# Patient Record
Sex: Female | Born: 1945 | Race: White | Hispanic: No | Marital: Married | Smoking: Current every day smoker
Health system: Southern US, Community
[De-identification: ages and names within clinical notes are randomized; demographics above are authoritative.]

## PROBLEM LIST (undated history)

## (undated) DIAGNOSIS — I700 Atherosclerosis of aorta: Secondary | ICD-10-CM

## (undated) DIAGNOSIS — E079 Disorder of thyroid, unspecified: Secondary | ICD-10-CM

## (undated) DIAGNOSIS — M810 Age-related osteoporosis without current pathological fracture: Secondary | ICD-10-CM

## (undated) DIAGNOSIS — E039 Hypothyroidism, unspecified: Secondary | ICD-10-CM

## (undated) DIAGNOSIS — J439 Emphysema, unspecified: Secondary | ICD-10-CM

## (undated) DIAGNOSIS — M1990 Unspecified osteoarthritis, unspecified site: Secondary | ICD-10-CM

## (undated) HISTORY — PX: TUBAL LIGATION: SHX77

## (undated) HISTORY — PX: THYROIDECTOMY, PARTIAL: SHX18

## (undated) HISTORY — PX: TONSILLECTOMY: SUR1361

## (undated) HISTORY — DX: Atherosclerosis of aorta: I70.0

## (undated) HISTORY — DX: Age-related osteoporosis without current pathological fracture: M81.0

## (undated) HISTORY — DX: Emphysema, unspecified: J43.9

---

## 2008-05-04 ENCOUNTER — Ambulatory Visit: Payer: Self-pay | Admitting: Family Medicine

## 2009-11-26 ENCOUNTER — Ambulatory Visit: Payer: Self-pay | Admitting: Internal Medicine

## 2011-11-07 ENCOUNTER — Ambulatory Visit: Payer: Self-pay | Admitting: Family Medicine

## 2012-10-18 ENCOUNTER — Ambulatory Visit: Payer: Self-pay | Admitting: Internal Medicine

## 2014-06-29 ENCOUNTER — Ambulatory Visit: Payer: Self-pay | Admitting: Family Medicine

## 2014-07-22 ENCOUNTER — Ambulatory Visit: Payer: Self-pay | Admitting: Family Medicine

## 2015-02-17 ENCOUNTER — Emergency Department: Payer: Self-pay | Admitting: Emergency Medicine

## 2015-09-24 ENCOUNTER — Ambulatory Visit
Admission: EM | Admit: 2015-09-24 | Discharge: 2015-09-24 | Disposition: A | Discharge status: Home / Self Care | Payer: Medicare Other | Attending: Family Medicine | Admitting: Family Medicine

## 2015-09-24 ENCOUNTER — Encounter: Payer: Self-pay | Admitting: Gynecology

## 2015-09-24 DIAGNOSIS — L255 Unspecified contact dermatitis due to plants, except food: Secondary | ICD-10-CM | POA: Diagnosis not present

## 2015-09-24 DIAGNOSIS — K1370 Unspecified lesions of oral mucosa: Secondary | ICD-10-CM

## 2015-09-24 HISTORY — DX: Disorder of thyroid, unspecified: E07.9

## 2015-09-24 HISTORY — DX: Unspecified osteoarthritis, unspecified site: M19.90

## 2015-09-24 MED ORDER — PREDNISONE 5 MG PO TABS: 5.0000 mg | ORAL_TABLET | Freq: Every day | ORAL | Status: AC

## 2015-09-24 MED ORDER — DIPHENHYDRAMINE HCL 25 MG PO TABS: 25.0000 mg | ORAL_TABLET | Freq: Four times a day (QID) | ORAL | Status: DC

## 2015-09-24 MED ORDER — PREDNISONE 20 MG PO TABS: 40.0000 mg | ORAL_TABLET | Freq: Every day | ORAL | Status: DC

## 2015-09-24 MED ORDER — RANITIDINE HCL 150 MG PO CAPS: 150.0000 mg | ORAL_CAPSULE | Freq: Two times a day (BID) | ORAL | Status: DC

## 2015-09-24 NOTE — Discharge Instructions (Signed)
Poison [facility] is an inflammation of the skin (contact dermatitis). It is caused by contact with the allergens on the leaves of the oak (toxicodendron) plants. Depending on your sensitivity, the rash may consist simply of redness and itching, or it may also progress to blisters which may break open (rupture). These must be well cared for to prevent secondary germ (bacterial) infection as these infections can lead to scarring. The eyes may also get puffy. The puffiness is worst in the morning and gets better as the day progresses. Healing is best accomplished by keeping any open areas dry, clean, covered with a bandage, and covered with an antibacterial ointment if needed. Without secondary infection, this dermatitis usually heals without scarring within 2 to 3 weeks without treatment. HOME CARE INSTRUCTIONS When you have been exposed to poison oak, it is very important to thoroughly wash with soap and water as soon as the exposure has been discovered. You have about one half hour to remove the plant resin before it will cause the rash. This cleaning will quickly destroy the oil or antigen on the skin (the antigen is what causes the rash). Wash aggressively under the fingernails as any plant resin still there will continue to spread the rash. Do not rub skin vigorously when washing affected area. Poison oak cannot spread if no oil from the plant remains on your body. Rash that has progressed to weeping sores (lesions) will not spread the rash unless you have not washed thoroughly. It is also important to clean any clothes you have been wearing as they may carry active allergens which will spread the rash, even several days later. Avoidance of the plant in the future is the best measure. Poison oak plants can be recognized by the number of leaves. Generally, poison oak has three leaves with flowering branches on a single stem. Diphenhydramine may be purchased over the counter and used as needed for  itching. Do not drive with this medication if it makes you drowsy. Ask your caregiver about medication for children. SEEK IMMEDIATE MEDICAL CARE IF:   Open areas of the rash develop.  You notice redness extending beyond the area of the rash.  There is a pus like discharge.  There is increased pain.  Other signs of infection develop (such as fever).   This information is not intended to replace advice given to you by your health care provider. Make sure you discuss any questions you have with your health care provider.   Document Released: 06/09/2003 Document Revised: 02/25/2012 Document Reviewed: 05/11/2015 Elsevier Interactive Patient Education 2016 [employer]. Poison [employer] ivy is a inflammation of the skin (contact dermatitis) caused by touching the allergens on the leaves of the ivy plant following previous exposure to the plant. The rash usually appears 48 hours after exposure. The rash is usually bumps (papules) or blisters (vesicles) in a linear pattern. Depending on your own sensitivity, the rash may simply cause redness and itching, or it may also progress to blisters which may break open. These must be well cared for to prevent secondary bacterial (germ) infection, followed by scarring. Keep any open areas dry, clean, dressed, and covered with an antibacterial ointment if needed. The eyes may also get puffy. The puffiness is worst in the morning and gets better as the day progresses. This dermatitis usually heals without scarring, within 2 to 3 weeks without treatment. HOME CARE INSTRUCTIONS  Thoroughly wash with soap and water as soon as you have been exposed to  poison ivy. You have about one half hour to remove the plant resin before it will cause the rash. This washing will destroy the oil or antigen on the skin that is causing, or will cause, the rash. Be sure to wash under your fingernails as any plant resin there will continue to spread the rash. Do not rub skin vigorously  when washing affected area. Poison ivy cannot spread if no oil from the plant remains on your body. A rash that has progressed to weeping sores will not spread the rash unless you have not washed thoroughly. It is also important to wash any clothes you have been wearing as these may carry active allergens. The rash will return if you wear the unwashed clothing, even several days later. Avoidance of the plant in the future is the best measure. Poison ivy plant can be recognized by the number of leaves. Generally, poison ivy has three leaves with flowering branches on a single stem. Diphenhydramine may be purchased over the counter and used as needed for itching. Do not drive with this medication if it makes you drowsy.Ask your caregiver about medication for children. SEEK MEDICAL CARE IF:  Open sores develop.  Redness spreads beyond area of rash.  You notice purulent (pus-like) discharge.  You have increased pain.  Other signs of infection develop (such as fever).   This information is not intended to replace advice given to you by your health care provider. Make sure you discuss any questions you have with your health care provider.   Document Released: 11/30/2000 Document Revised: 02/25/2012 Document Reviewed: 05/11/2015 Elsevier Interactive Patient Education 2016 [employer]  Monitor for signs of infection e.g. Fever, chills, purulent discharge, worsening pain/swelling and follow up for re-evaluation if this occurs Do not itch, may rub ice over affected areas discussed benadryl, hydrocortisone, prednisone, zantac uses/possible side effects with patient

## 2015-09-24 NOTE — ED Notes (Signed)
Patient c/o poison oak x last Sunday while working outside her home.

## 2015-09-24 NOTE — ED Provider Notes (Signed)
CSN: [id number]     Arrival date & time 09/24/15  4098 History   First MD Initiated Contact with Patient 09/24/15 1024     Chief Complaint  Patient presents with  . Poison Ivy   (Consider location/radiation/quality/duration/timing/severity/associated sxs/prior Treatment) HPI Comments: Married caucasian female here for spreading rash after accidental exposure to poison oak and ivy last weekend when working in the yard.  It was wrapped up in the ivy and she didn't see until she had already touched, short sleeves/pants was sweating and probably wiped neck/chin.  Has lesions on arms/neck/chin.  Itching like crazy took 2 benadryl last night and put on hydrocortisone cream.  Last episode over a year ago has had poison ivy/oak a couple times since 9th grade.  Denied dyspnea, dysphagia, cough, tongue swelling, vomiting, diarrhea, abdomen pain.    The history is provided by the patient.    Past Medical History  Diagnosis Date  . Thyroid disease   . Osteoarthritis    Past Surgical History  Procedure Laterality Date  . Tonsillectomy    . Tubal ligation     No family history on file. Social History  Substance Use Topics  . Smoking status: Current Every Day Smoker -- 1.00 packs/day    Types: Cigarettes  . Smokeless tobacco: None  . Alcohol Use: Yes   OB History    No data available     Review of Systems  Constitutional: Negative for fever, chills, diaphoresis, activity change, appetite change, fatigue and unexpected weight change.  HENT: Positive for mouth sores. Negative for congestion, dental problem, drooling, ear discharge, ear pain, facial swelling, hearing loss, nosebleeds, postnasal drip, rhinorrhea, sinus pressure, sneezing, sore throat, tinnitus, trouble swallowing and voice change.   Eyes: Negative for photophobia, pain, discharge, redness, itching and visual disturbance.  Respiratory: Negative for cough, choking, chest tightness, shortness of breath, wheezing and stridor.     Cardiovascular: Negative for chest pain, palpitations and leg swelling.  Gastrointestinal: Negative for nausea, vomiting, abdominal pain, diarrhea, constipation, blood in stool, abdominal distention and rectal pain.  Endocrine: Negative for cold intolerance and heat intolerance.  Genitourinary: Negative for dysuria, frequency and hematuria.  Musculoskeletal: Negative for myalgias, back pain, joint swelling, arthralgias, gait problem, neck pain and neck stiffness.  Skin: Positive for color change and rash. Negative for pallor and wound.  Allergic/Immunologic: Positive for environmental allergies. Negative for food allergies.  Neurological: Negative for dizziness, tremors, seizures, syncope, facial asymmetry, speech difficulty, weakness, light-headedness, numbness and headaches.  Hematological: Negative for adenopathy. Does not bruise/bleed easily.  Psychiatric/Behavioral: Negative for behavioral problems, confusion, sleep disturbance and agitation.    Allergies  Sulfa antibiotics  Home Medications   Prior to Admission medications   Medication Sig Start Date End Date Taking? Authorizing Provider  aspirin 81 MG tablet Take 81 mg by mouth daily.   Yes Historical Provider, MD  Calcium Carb-Cholecalciferol (CALCIUM 600-D PO) Take by mouth.   Yes Historical Provider, MD  DiphenhydrAMINE HCl (BENADRYL ALLERGY PO) Take by mouth.   Yes Historical Provider, MD  hydrocortisone cream 0.5 % Apply 1 application topically 2 (two) times daily.   Yes Historical Provider, MD  Multiple Vitamin (MULTIVITAMIN) capsule Take 1 capsule by mouth daily.   Yes Historical Provider, MD  thyroid (ARMOUR) 60 MG tablet Take 60 mg by mouth daily before breakfast.   Yes Historical Provider, MD  diphenhydrAMINE (BENADRYL) 25 MG tablet Take 1 tablet (25 mg total) by mouth every 6 (six) hours. 09/24/15   Jarold Song  Yaniv Lage, NP  predniSONE (DELTASONE) 20 MG tablet Take 2 tablets (40 mg total) by mouth daily with breakfast. x4  days then x4days;  x4d x4d 09/24/15   Barbaraann Barthel, NP  predniSONE (DELTASONE) 5 MG tablet Take 1 tablet (5 mg total) by mouth daily with breakfast. Start after  pills completed 10/10/15 10/13/15  Barbaraann Barthel, NP  ranitidine (ZANTAC) 150 MG capsule Take 1 capsule (150 mg total) by mouth 2 (two) times daily. 09/24/15   Barbaraann Barthel, NP   Meds Ordered and Administered this Visit  Medications - No data to display  BP 114/82 mmHg  Pulse 83  Temp(Src) 98.9 F (37.2 C) (Tympanic)  Resp 16  Ht  (1.651 m)  Wt 132 lb (59.875 kg)  BMI 21.97 kg/m2  SpO2 99% No data found.   Physical Exam  Constitutional: She is oriented to person, place, and time. Vital signs are normal. She appears well-developed and well-nourished. No distress.  HENT:  Head: Normocephalic and atraumatic.  Right Ear: Hearing, external ear and ear canal normal. A middle ear effusion is present.  Left Ear: Hearing, external ear and ear canal normal. A middle ear effusion is present.  Nose: Mucosal edema present. No rhinorrhea, nose lacerations, sinus tenderness, nasal deformity, septal deviation or nasal septal hematoma. No epistaxis.  No foreign bodies. Right sinus exhibits no maxillary sinus tenderness and no frontal sinus tenderness. Left sinus exhibits no maxillary sinus tenderness and no frontal sinus tenderness.  Mouth/Throat: Oropharynx is clear and moist. Mucous membranes are not pale, not dry and not cyanotic. She does not have dentures. Oral lesions present. No trismus in the jaw. Normal dentition. No dental abscesses, uvula swelling, lacerations or dental caries. No oropharyngeal exudate, posterior oropharyngeal edema, posterior oropharyngeal erythema or tonsillar abscesses.    Oral tori x 3 1 hard palate and 2 sublingual 1x2cm each no ulcers oral mucosa noted slight erythema buccal; bilateral TMs with air fluid level clear  Eyes: Conjunctivae, EOM and lids are normal. Pupils are equal,  round, and reactive to light. Right eye exhibits no chemosis, no discharge, no exudate and no hordeolum. No foreign body present in the right eye. Left eye exhibits no chemosis, no discharge, no exudate and no hordeolum. No foreign body present in the left eye. Right conjunctiva is not injected. Right conjunctiva has no hemorrhage. Left conjunctiva is not injected. Left conjunctiva has no hemorrhage. No scleral icterus. Right eye exhibits normal extraocular motion and no nystagmus. Left eye exhibits normal extraocular motion and no nystagmus. Right pupil is round and reactive. Left pupil is round and reactive. Pupils are equal.  Neck: Trachea normal and normal range of motion. Neck supple. No tracheal tenderness, no spinous process tenderness and no muscular tenderness present. No rigidity. Erythema present. No tracheal deviation, no edema and normal range of motion present. No thyroid mass and no thyromegaly present.    Grouped macular papular lesions with slight erythema itchy anterior neck and chin dry  Cardiovascular: Normal rate, regular rhythm, S1 normal, S2 normal, normal heart sounds and intact distal pulses.  Exam reveals no gallop and no friction rub.   No murmur heard. Pulses:      Radial pulses are 2+ on the right side, and 2+ on the left side.  Pulmonary/Chest: Effort normal and breath sounds normal. No accessory muscle usage or stridor. No respiratory distress. She has no decreased breath sounds. She has no wheezes. She has no rhonchi. She has no rales. She exhibits  no tenderness.  Abdominal: Soft. Bowel sounds are normal. She exhibits no shifting dullness, no distension, no pulsatile liver, no fluid wave, no abdominal bruit, no ascites, no pulsatile midline mass and no mass. There is no hepatosplenomegaly. There is no tenderness. There is no rigidity, no rebound, no guarding, no tenderness at McBurney's point and negative Murphy's sign. Hernia confirmed negative in the ventral area.  Dull  to percussion x 4 quads  Musculoskeletal: Normal range of motion. She exhibits no edema or tenderness.  Lymphadenopathy:       Head (right side): No submental, no submandibular, no tonsillar, no preauricular, no posterior auricular and no occipital adenopathy present.       Head (left side): No submental, no submandibular, no tonsillar, no preauricular, no posterior auricular and no occipital adenopathy present.    She has no cervical adenopathy.       Right cervical: No superficial cervical, no deep cervical and no posterior cervical adenopathy present.      Left cervical: No superficial cervical, no deep cervical and no posterior cervical adenopathy present.  Neurological: She is alert and oriented to person, place, and time. She exhibits normal muscle tone. Coordination normal.  Skin: Skin is warm, dry and intact. Rash noted. No abrasion, no bruising, no burn, no ecchymosis, no laceration, no lesion, no petechiae and no purpura noted. Rash is maculopapular and vesicular. Rash is not macular, not papular, not nodular, not pustular and not urticarial. She is not diaphoretic. There is erythema. No cyanosis. No pallor. Nails show no clubbing.     macularpapular erythema mild some vesicules intact small grouped anterior forearms dry patient itching during exam  Psychiatric: She has a normal mood and affect. Her speech is normal and behavior is normal. Judgment and thought content normal. Cognition and memory are normal.  Nursing note and vitals reviewed.   ED Course  Procedures (including critical care time)  Labs Review Labs Reviewed - No data to display  Imaging Review No results found.    MDM   1. Contact dermatitis due to plant   2. Oral lesion    Prefers benadryl for itching.  Has cortisone cream and benadryl at home.  Rx given.  Medication as directed.  Symptomatic therapy suggested e.g. Calamine lotion, benadryl 25mg  po QID prn breakthrough itching. Avoid driving until known if  drowsiness from prednisone and benadryl.  Discussed benadryl can also cause urinary retention and if difficulty urinating to stop benadryl. Start prednisone po taper 40mg  to 5mg  x 20 days.  May use ice if breakthrough itching.  Warm to cool water soaks and/or oatmeal baths.  Wash bedding and towels do not reuse. Call or return to clinic as needed if these symptoms worsen or fail to improve as anticipated especially lesions noted on eye, visual changes or visual loss.  Exitcare handout on poison ivy, poison oak  given to patient.  Discussed avoidance/no contact wear of long sleeves/pants/socks/gloves and handkerchief around neck/mouth/face and use of poison ivy block cream along with tepid shower immediately after completion of yard work.   Do not burn poison oak/ivy.  Avoid scratching lesions to prevent secondary infections.   Patient verbalized agreement and understanding of treatment plan and had no further questions at this time.    Oral Tori patient reported difficulty with brushing teeth due to size.  Discussed biotene mouthwash oral cutettes may also be helpful for oral care.  Keep regular follow up appts with dentist.  Follow up sooner if erythema, purulent discharge,  fever, sudden enlargement/tenderness of tori.  Patient verbalized understanding of information/instructions, agreed with plan of care and had no further questions at this time.  Barbaraann Barthel, NP 09/24/15 1139

## 2015-11-21 ENCOUNTER — Other Ambulatory Visit: Payer: Self-pay | Admitting: Oral & Maxillofacial Surgery

## 2015-11-21 DIAGNOSIS — M278 Other specified diseases of jaws: Secondary | ICD-10-CM

## 2015-11-21 DIAGNOSIS — K1370 Unspecified lesions of oral mucosa: Secondary | ICD-10-CM

## 2015-11-25 ENCOUNTER — Ambulatory Visit
Admission: RE | Admit: 2015-11-25 | Discharge: 2015-11-25 | Disposition: A | Discharge status: Home / Self Care | Payer: Medicare Other | Source: Ambulatory Visit | Attending: Oral & Maxillofacial Surgery | Admitting: Oral & Maxillofacial Surgery

## 2015-11-25 DIAGNOSIS — M278 Other specified diseases of jaws: Secondary | ICD-10-CM | POA: Insufficient documentation

## 2015-11-25 DIAGNOSIS — K1370 Unspecified lesions of oral mucosa: Secondary | ICD-10-CM | POA: Insufficient documentation

## 2016-01-09 DIAGNOSIS — M1990 Unspecified osteoarthritis, unspecified site: Secondary | ICD-10-CM | POA: Insufficient documentation

## 2016-01-09 DIAGNOSIS — E039 Hypothyroidism, unspecified: Secondary | ICD-10-CM | POA: Insufficient documentation

## 2016-01-09 DIAGNOSIS — Z720 Tobacco use: Secondary | ICD-10-CM | POA: Insufficient documentation

## 2016-05-31 ENCOUNTER — Ambulatory Visit (INDEPENDENT_AMBULATORY_CARE_PROVIDER_SITE_OTHER): Payer: Medicare Other | Admitting: Family Medicine

## 2016-05-31 ENCOUNTER — Encounter: Payer: Self-pay | Admitting: Family Medicine

## 2016-05-31 VITALS — BP 110/62 | HR 64 | Ht 65.0 in | Wt 131.0 lb

## 2016-05-31 DIAGNOSIS — T148 Other injury of unspecified body region: Secondary | ICD-10-CM | POA: Diagnosis not present

## 2016-05-31 DIAGNOSIS — W57XXXA Bitten or stung by nonvenomous insect and other nonvenomous arthropods, initial encounter: Secondary | ICD-10-CM

## 2016-05-31 MED ORDER — DOXYCYCLINE MONOHYDRATE 100 MG PO TABS
100.0000 mg | ORAL_TABLET | Freq: Two times a day (BID) | ORAL | Status: DC
Start: 2016-05-31 — End: 2016-11-27

## 2016-05-31 NOTE — Patient Instructions (Signed)

## 2016-05-31 NOTE — Progress Notes (Signed)
Name: [name]   MRN: [id number]    DOB: 04-22-1946   Date:05/31/2016       Progress Note  Subjective  Chief Complaint  Chief Complaint  Patient presents with  . Insect Bite    removed 2 ticks off body- has left behind a spot of redness and irritation on abdomen    Rash This is a chronic problem. The current episode started 1 to 4 weeks ago. The problem is unchanged. The affected locations include the torso and left arm. The rash is characterized by redness. Associated with: tick bite. Pertinent negatives include no anorexia, congestion, cough, diarrhea, eye pain, facial edema, fatigue, fever, joint pain, nail changes, rhinorrhea, shortness of breath, sore throat or vomiting. Past treatments include nothing. The treatment provided mild relief.    No problem-specific assessment & plan notes found for this encounter.   Past Medical History  Diagnosis Date  . Thyroid disease   . Osteoarthritis     Past Surgical History  Procedure Laterality Date  . Tonsillectomy    . Tubal ligation      History reviewed. No pertinent family history.  Social History   Social History  . Marital Status: Married    Spouse Name: N/A  . Number of Children: N/A  . Years of Education: N/A   Occupational History  . Not on file.   Social History Main Topics  . Smoking status: Current Every Day Smoker -- 1.00 packs/day    Types: Cigarettes  . Smokeless tobacco: Not on file  . Alcohol Use: Yes  . Drug Use: No  . Sexual Activity: Not on file   Other Topics Concern  . Not on file   Social History Narrative    Allergies  Allergen Reactions  . Sulfa Antibiotics     GI Bleeding     Review of Systems  Constitutional: Negative for fever, chills, weight loss, malaise/fatigue and fatigue.  HENT: Negative for congestion, ear discharge, ear pain, rhinorrhea and sore throat.   Eyes: Negative for blurred vision and pain.  Respiratory: Negative for cough, sputum production, shortness  of breath and wheezing.   Cardiovascular: Negative for chest pain, palpitations and leg swelling.  Gastrointestinal: Negative for heartburn, nausea, vomiting, abdominal pain, diarrhea, constipation, blood in stool, melena and anorexia.  Genitourinary: Negative for dysuria, urgency, frequency and hematuria.  Musculoskeletal: Negative for myalgias, back pain, joint pain and neck pain.  Skin: Positive for rash. Negative for nail changes.  Neurological: Negative for dizziness, tingling, sensory change, focal weakness and headaches.  Endo/Heme/Allergies: Negative for environmental allergies and polydipsia. Does not bruise/bleed easily.  Psychiatric/Behavioral: Negative for depression and suicidal ideas. The patient is not nervous/anxious and does not have insomnia.      Objective  Filed Vitals:   05/31/16 1614  BP: 110/62  Pulse: 64  Height:  (1.651 m)  Weight: 131 lb (59.421 kg)    Physical Exam  Constitutional: She is well-developed, well-nourished, and in no distress. No distress.  HENT:  Head: Normocephalic and atraumatic.  Right Ear: External ear normal.  Left Ear: External ear normal.  Nose: Nose normal.  Mouth/Throat: Oropharynx is clear and moist.  Eyes: Conjunctivae and EOM are normal. Pupils are equal, round, and reactive to light. Right eye exhibits no discharge. Left eye exhibits no discharge.  Neck: Normal range of motion. Neck supple. No JVD present. No thyromegaly present.  Cardiovascular: Normal rate, regular rhythm, normal heart sounds and intact distal pulses.  Exam reveals no gallop  and no friction rub.   No murmur heard. Pulmonary/Chest: Effort normal and breath sounds normal.  Abdominal: Soft. Bowel sounds are normal. She exhibits no mass. There is no tenderness. There is no guarding.  Musculoskeletal: Normal range of motion. She exhibits no edema.  Lymphadenopathy:    She has no cervical adenopathy.  Neurological: She is alert. She has normal reflexes.   Skin: Skin is warm and dry. She is not diaphoretic. There is erythema.  deertick  Psychiatric: Mood and affect normal.  Nursing note and vitals reviewed.     Assessment & Plan  Problem List Items Addressed This Visit    None    Visit Diagnoses    Tick bite    -  Primary     Doxycycline was sent into [facility]    Dr. Hayden Rasmusseneanna Gared Gillie [facility]  05/31/2016

## 2016-11-27 ENCOUNTER — Ambulatory Visit
Admission: EM | Admit: 2016-11-27 | Discharge: 2016-11-27 | Discharge status: Absent without leave | Payer: Medicare Other

## 2016-11-27 ENCOUNTER — Ambulatory Visit (INDEPENDENT_AMBULATORY_CARE_PROVIDER_SITE_OTHER): Payer: Medicare Other | Admitting: Family Medicine

## 2016-11-27 ENCOUNTER — Encounter: Payer: Self-pay | Admitting: Family Medicine

## 2016-11-27 VITALS — BP 120/62 | HR 80 | Ht 65.0 in | Wt 133.0 lb

## 2016-11-27 DIAGNOSIS — F17210 Nicotine dependence, cigarettes, uncomplicated: Secondary | ICD-10-CM

## 2016-11-27 DIAGNOSIS — H1032 Unspecified acute conjunctivitis, left eye: Secondary | ICD-10-CM

## 2016-11-27 DIAGNOSIS — J0100 Acute maxillary sinusitis, unspecified: Secondary | ICD-10-CM | POA: Diagnosis not present

## 2016-11-27 MED ORDER — TOBRAMYCIN 0.3 % OP SOLN: 1.0000 [drp] | OPHTHALMIC | 0 refills | Status: DC

## 2016-11-27 MED ORDER — AMOXICILLIN-POT CLAVULANATE 875-125 MG PO TABS
1.0000 | ORAL_TABLET | Freq: Two times a day (BID) | ORAL | 0 refills | Status: DC
Start: 2016-11-27 — End: 2017-04-09

## 2016-11-27 MED ORDER — VARENICLINE TARTRATE 0.5 MG X 11 & 1 MG X 42 PO MISC: ORAL | 0 refills | Status: DC

## 2016-11-27 NOTE — Progress Notes (Signed)
Name: Kaitlyn NettersLinda Parker Winders   MRN: [id number]    DOB: 26-Apr-1946   Date:11/27/2016       Progress Note  Subjective  Chief Complaint  Chief Complaint  Patient presents with  . Sinusitis    cough and cong- R) side facial pain  . eye redness    L) eye    Sinusitis  This is a new problem. The current episode started in the past 7 days. The problem has been gradually worsening since onset. There has been no fever. The pain is moderate. Associated symptoms include congestion, coughing, headaches and sinus pressure. Pertinent negatives include no chills, diaphoresis, ear pain, hoarse voice, neck pain, shortness of breath, sneezing, sore throat or swollen glands. Past treatments include oral decongestants. The treatment provided mild relief.    No problem-specific Assessment & Plan notes found for this encounter.   Past Medical History:  Diagnosis Date  . Osteoarthritis   . Osteoporosis   . Thyroid disease     Past Surgical History:  Procedure Laterality Date  . TONSILLECTOMY    . TUBAL LIGATION      History reviewed. No pertinent family history.  Social History   Social History  . Marital status: Married    Spouse name: N/A  . Number of children: N/A  . Years of education: N/A   Occupational History  . Not on file.   Social History Main Topics  . Smoking status: Current Every Day Smoker    Packs/day: 1.00    Types: Cigarettes  . Smokeless tobacco: Not on file  . Alcohol use Yes  . Drug use: No  . Sexual activity: Not Currently   Other Topics Concern  . Not on file   Social History Narrative  . No narrative on file    Allergies  Allergen Reactions  . Sulfa Antibiotics     GI Bleeding     Review of Systems  Constitutional: Negative for chills, diaphoresis, fever, malaise/fatigue and weight loss.  HENT: Positive for congestion and sinus pressure. Negative for ear discharge, ear pain, hoarse voice, nosebleeds, sinus pain, sneezing and sore throat.   Eyes:  Positive for redness. Negative for blurred vision, double vision, photophobia, pain and discharge.  Respiratory: Positive for cough. Negative for sputum production, shortness of breath, wheezing and stridor.   Cardiovascular: Negative for chest pain, palpitations and leg swelling.  Gastrointestinal: Negative for abdominal pain, blood in stool, constipation, diarrhea, heartburn, melena and nausea.  Genitourinary: Negative for dysuria, frequency, hematuria and urgency.  Musculoskeletal: Negative for back pain, joint pain, myalgias and neck pain.  Skin: Negative for rash.  Neurological: Positive for headaches. Negative for dizziness, tingling, sensory change and focal weakness.  Endo/Heme/Allergies: Negative for environmental allergies and polydipsia. Does not bruise/bleed easily.  Psychiatric/Behavioral: Negative for depression and suicidal ideas. The patient is not nervous/anxious and does not have insomnia.      Objective  Vitals:   11/27/16 1349  BP: 120/62  Pulse: 80  Weight: 133 lb (60.3 kg)  Height: 5\' 5"  (1.651 m)    Physical Exam  Constitutional: She is well-developed, well-nourished, and in no distress. No distress.  HENT:  Head: Normocephalic and atraumatic.  Right Ear: External ear normal.  Left Ear: External ear normal.  Nose: Nose normal.  Mouth/Throat: Oropharynx is clear and moist.  Eyes: EOM are normal. Pupils are equal, round, and reactive to light. Right eye exhibits no discharge. Left eye exhibits no discharge. Right conjunctiva is not injected. Left conjunctiva is injected.  Neck: Normal range of motion. Neck supple. No JVD present. No thyromegaly present.  Cardiovascular: Normal rate, regular rhythm, normal heart sounds and intact distal pulses.  Exam reveals no gallop and no friction rub.   No murmur heard. Pulmonary/Chest: Effort normal and breath sounds normal. She has no wheezes. She has no rales. She exhibits no tenderness.  Abdominal: Soft. Bowel sounds  are normal. She exhibits no distension and no mass. There is no tenderness. There is no rebound and no guarding.  Musculoskeletal: Normal range of motion. She exhibits no edema.  Lymphadenopathy:    She has no cervical adenopathy.  Neurological: She is alert. She has normal reflexes.  Skin: Skin is warm and dry. She is not diaphoretic.  Psychiatric: Mood and affect normal.      Assessment & Plan  Problem List Items Addressed This Visit    None    Visit Diagnoses    Acute maxillary sinusitis, recurrence not specified    -  Primary   Relevant Medications   amoxicillin-clavulanate (AUGMENTIN) 875-125 MG tablet   Acute conjunctivitis of left eye, unspecified acute conjunctivitis type       Relevant Medications   tobramycin (TOBREX) 0.3 % ophthalmic solution   Cigarette nicotine dependence without complication       Relevant Medications   varenicline (CHANTIX STARTING MONTH PAK) 0.5 MG X 11 & 1 MG X 42 tablet        Dr. Hayden Rasmusseneanna Jones [facility]  11/27/16

## 2017-04-09 ENCOUNTER — Encounter: Payer: Self-pay | Admitting: Family Medicine

## 2017-04-09 ENCOUNTER — Ambulatory Visit (INDEPENDENT_AMBULATORY_CARE_PROVIDER_SITE_OTHER): Payer: Medicare Other | Admitting: Family Medicine

## 2017-04-09 ENCOUNTER — Ambulatory Visit
Admission: RE | Admit: 2017-04-09 | Discharge: 2017-04-09 | Disposition: A | Discharge status: Home / Self Care | Payer: Medicare Other | Source: Ambulatory Visit | Attending: Family Medicine | Admitting: Family Medicine

## 2017-04-09 VITALS — BP 124/78 | HR 76 | Temp 98.7°F | Ht 65.0 in | Wt 131.0 lb

## 2017-04-09 DIAGNOSIS — J0101 Acute recurrent maxillary sinusitis: Secondary | ICD-10-CM

## 2017-04-09 DIAGNOSIS — J432 Centrilobular emphysema: Secondary | ICD-10-CM

## 2017-04-09 DIAGNOSIS — J449 Chronic obstructive pulmonary disease, unspecified: Secondary | ICD-10-CM | POA: Insufficient documentation

## 2017-04-09 DIAGNOSIS — J40 Bronchitis, not specified as acute or chronic: Secondary | ICD-10-CM | POA: Diagnosis not present

## 2017-04-09 MED ORDER — ALBUTEROL SULFATE HFA 108 (90 BASE) MCG/ACT IN AERS
2.0000 | INHALATION_SPRAY | Freq: Four times a day (QID) | RESPIRATORY_TRACT | 0 refills | Status: DC | PRN
Start: 2017-04-09 — End: 2018-05-21

## 2017-04-09 MED ORDER — GUAIFENESIN-CODEINE 100-10 MG/5ML PO SYRP: 5.0000 mL | ORAL_SOLUTION | Freq: Three times a day (TID) | ORAL | 0 refills | Status: DC | PRN

## 2017-04-09 MED ORDER — AMOXICILLIN-POT CLAVULANATE 875-125 MG PO TABS: 1.0000 | ORAL_TABLET | Freq: Two times a day (BID) | ORAL | 0 refills | Status: DC

## 2017-04-09 NOTE — Progress Notes (Signed)
Name: [name]   MRN: [id number]    DOB: 1946/07/02   Date:04/09/2017       Progress Note  Subjective  Chief Complaint  Chief Complaint  Patient presents with  . Sinusitis    has been having coughing and cong and sore throat x 4 days- sore throat is better but still has cough and cong- small amount of production from nose    Sinusitis  This is a new problem. The current episode started 1 to 4 weeks ago (10 days). The problem has been waxing and waning since onset. Maximum temperature: low grade. The fever has been present for 3 to 4 days. Associated symptoms include chills, congestion, coughing, diaphoresis, headaches, a hoarse voice, shortness of breath, sinus pressure, sneezing and a sore throat. Pertinent negatives include no ear pain, neck pain or swollen glands. The treatment provided mild relief.  Cough  This is a new problem. The current episode started in the past 7 days. The problem has been waxing and waning. The cough is productive of sputum. Associated symptoms include chills, a fever, headaches, nasal congestion, postnasal drip, a sore throat, shortness of breath and wheezing. Pertinent negatives include no chest pain, ear pain, heartburn, hemoptysis, myalgias, rash or weight loss. The symptoms are aggravated by pollens. She has tried nothing for the symptoms. There is no history of environmental allergies.    No problem-specific Assessment & Plan notes found for this encounter.   Past Medical History:  Diagnosis Date  . Osteoarthritis   . Osteoporosis   . Thyroid disease     Past Surgical History:  Procedure Laterality Date  . TONSILLECTOMY    . TUBAL LIGATION      No family history on file.  Social History   Social History  . Marital status: Married    Spouse name: N/A  . Number of children: N/A  . Years of education: N/A   Occupational History  . Not on file.   Social History Main Topics  . Smoking status: Current Every Day Smoker   Packs/day: 1.00    Types: Cigarettes  . Smokeless tobacco: Never Used     Comment: discussed quiting with pt  . Alcohol use Yes  . Drug use: No  . Sexual activity: Not Currently   Other Topics Concern  . Not on file   Social History Narrative  . No narrative on file    Allergies  Allergen Reactions  . Sulfa Antibiotics     GI Bleeding    Outpatient Medications Prior to Visit  Medication Sig Dispense Refill  . alendronate (FOSAMAX) 70 MG tablet Take 1 tablet by mouth once a week. Dr Nelia Shi    . Calcium Carb-Cholecalciferol (CALCIUM 600-D PO) Take by mouth.    . Multiple Vitamin (MULTIVITAMIN) capsule Take 1 capsule by mouth daily.    Marland Kitchen thyroid (ARMOUR) 60 MG tablet Take 60 mg by mouth daily before breakfast.    . amoxicillin-clavulanate (AUGMENTIN) 875-125 MG tablet Take 1 tablet by mouth 2 (two) times daily. 20 tablet 0  . aspirin 81 MG tablet Take 81 mg by mouth daily.    Marland Kitchen tobramycin (TOBREX) 0.3 % ophthalmic solution Place 1 drop into both eyes every 4 (four) hours. 5 mL 0  . varenicline (CHANTIX STARTING MONTH PAK) 0.5 MG X 11 & 1 MG X 42 tablet Take one 0.5 mg tablet by mouth once daily for 3 days, then increase to one 0.5 mg tablet twice daily for 4 days, then  increase to one 1 mg tablet twice daily. 53 tablet 0   No facility-administered medications prior to visit.     Review of Systems  Constitutional: Positive for chills, diaphoresis and fever. Negative for malaise/fatigue and weight loss.  HENT: Positive for congestion, hoarse voice, postnasal drip, sinus pressure, sneezing and sore throat. Negative for ear discharge and ear pain.   Eyes: Negative for blurred vision.  Respiratory: Positive for cough, shortness of breath and wheezing. Negative for hemoptysis and sputum production.   Cardiovascular: Negative for chest pain, palpitations and leg swelling.  Gastrointestinal: Negative for abdominal pain, blood in stool, constipation, diarrhea, heartburn, melena and  nausea.  Genitourinary: Negative for dysuria, frequency, hematuria and urgency.  Musculoskeletal: Negative for back pain, joint pain, myalgias and neck pain.  Skin: Negative for rash.  Neurological: Positive for headaches. Negative for dizziness, tingling, sensory change and focal weakness.  Endo/Heme/Allergies: Negative for environmental allergies and polydipsia. Does not bruise/bleed easily.  Psychiatric/Behavioral: Negative for depression and suicidal ideas. The patient is not nervous/anxious and does not have insomnia.      Objective  Vitals:   04/09/17 0836  BP: 124/78  Pulse: 76  Temp: 98.7 F (37.1 C)  TempSrc: Oral  Weight: 131 lb (59.4 kg)  Height:  (1.651 m)    Physical Exam  Constitutional: She is well-developed, well-nourished, and in no distress. No distress.  HENT:  Head: Normocephalic and atraumatic.  Right Ear: External ear normal.  Left Ear: External ear normal.  Nose: Nose normal.  Mouth/Throat: Oropharynx is clear and moist.  Eyes: Conjunctivae and EOM are normal. Pupils are equal, round, and reactive to light. Right eye exhibits no discharge. Left eye exhibits no discharge.  Neck: Normal range of motion. Neck supple. No JVD present. No thyromegaly present.  Cardiovascular: Normal rate, regular rhythm, normal heart sounds and intact distal pulses.  Exam reveals no gallop and no friction rub.   No murmur heard. Pulmonary/Chest: Effort normal. No respiratory distress. She has wheezes. She has no rales. She exhibits no tenderness.  Abdominal: Soft. Bowel sounds are normal. She exhibits no mass. There is no tenderness. There is no guarding.  Musculoskeletal: Normal range of motion. She exhibits no edema.  Lymphadenopathy:    She has no cervical adenopathy.  Neurological: She is alert.  Skin: Skin is warm and dry. She is not diaphoretic.  Psychiatric: Mood and affect normal.  Nursing note and vitals reviewed.     Assessment & Plan  Problem List  Items Addressed This Visit    None    Visit Diagnoses    Centrilobular emphysema (HCC)    -  Primary   Relevant Medications   albuterol (PROVENTIL HFA;VENTOLIN HFA) 108 (90 Base) MCG/ACT inhaler   guaiFENesin-codeine (ROBITUSSIN AC) 100-10 MG/5ML syrup   Other Relevant Orders   DG Chest 2 View (Completed)   Bronchitis       Relevant Medications   amoxicillin-clavulanate (AUGMENTIN) 875-125 MG tablet   guaiFENesin-codeine (ROBITUSSIN AC) 100-10 MG/5ML syrup   Other Relevant Orders   DG Chest 2 View (Completed)   Acute recurrent maxillary sinusitis       Relevant Medications   amoxicillin-clavulanate (AUGMENTIN) 875-125 MG tablet   guaiFENesin-codeine (ROBITUSSIN AC) 100-10 MG/5ML syrup   Other Relevant Orders   DG Chest 2 View (Completed)      Meds ordered this encounter  Medications  . amoxicillin-clavulanate (AUGMENTIN) 875-125 MG tablet    Sig: Take 1 tablet by mouth 2 (two) times daily.  Dispense:  20 tablet    Refill:  0  . albuterol (PROVENTIL HFA;VENTOLIN HFA) 108 (90 Base) MCG/ACT inhaler    Sig: Inhale 2 puffs into the lungs every 6 (six) hours as needed for wheezing or shortness of breath.    Dispense:  1 Inhaler    Refill:  0  . guaiFENesin-codeine (ROBITUSSIN AC) 100-10 MG/5ML syrup    Sig: Take 5 mLs by mouth 3 (three) times daily as needed for cough.    Dispense:  150 mL    Refill:  0      Dr. Elizabeth Sauer [facility]  04/09/17

## 2017-04-09 NOTE — Patient Instructions (Signed)
How to Use a Metered Dose Inhaler A metered dose inhaler is a handheld device for taking medicine that must be breathed into the lungs (inhaled). The device can be used to deliver a variety of inhaled medicines, including:  Quick relief or rescue medicines, such as bronchodilators.  Controller medicines, such as corticosteroids.  The medicine is delivered by pushing down on a metal canister to release a preset amount of spray and medicine. Each device contains the amount of medicine that is needed for a preset number of uses (inhalations). Your health care provider may recommend that you use a spacer with your inhaler to help you take the medicine more effectively. A spacer is a plastic tube with a mouthpiece on one end and an opening that connects to the inhaler on the other end. A spacer holds the medicine in a tube for a short time, which allows you to inhale more medicine. What are the risks? If you do not use your inhaler correctly, medicine might not reach your lungs to help you breathe. Inhaler medicine can cause side effects, such as:  Mouth or throat infection.  Cough.  Hoarseness.  Headache.  Nausea and vomiting.  Lung infection (pneumonia) in people who have a lung condition called COPD.  How to use a metered dose inhaler without a spacer 1. Remove the cap from the inhaler. 2. If you are using the inhaler for the first time, shake it for 5 seconds, turn it away from your face, then release 4 puffs into the air. This is called priming. 3. Shake the inhaler for 5 seconds. 4. Position the inhaler so the top of the canister faces up. 5. Put your index finger on the top of the medicine canister. Support the bottom of the inhaler with your thumb. 6. Breathe out normally and as completely as possible, away from the inhaler. 7. Either place the inhaler between your teeth and close your lips tightly around the mouthpiece, or hold the inhaler 1-2 inches (2.5-5 cm) away from your open  mouth. Keep your tongue down out of the way. If you are unsure which technique to use, ask your health care provider. 8. Press the canister down with your index finger to release the medicine, then inhale deeply and slowly through your mouth (not your nose) until your lungs are completely filled. Inhaling should take 4-6 seconds. 9. Hold the medicine in your lungs for 5-10 seconds (10 seconds is best). This helps the medicine get into the small airways of your lungs. 10. With your lips in a tight circle (pursed), breathe out slowly. 11. Repeat steps 3-10 until you have taken the number of puffs that your health care provider directed. Wait about 1 minute between puffs or as directed. 12. Put the cap on the inhaler. 13. If you are using a steroid inhaler, rinse your mouth with water, gargle, and spit out the water. Do not swallow the water. How to use a metered dose inhaler with a spacer 1. Remove the cap from the inhaler. 2. If you are using the inhaler for the first time, shake it for 5 seconds, turn it away from your face, then release 4 puffs into the air. This is called priming. 3. Shake the inhaler for 5 seconds. 4. Place the open end of the spacer onto the inhaler mouthpiece. 5. Position the inhaler so the top of the canister faces up and the spacer mouthpiece faces you. 6. Put your index finger on the top of the medicine canister.   Support the bottom of the inhaler and the spacer with your thumb. 7. Breathe out normally and as completely as possible, away from the spacer. 8. Place the spacer between your teeth and close your lips tightly around it. Keep your tongue down out of the way. 9. Press the canister down with your index finger to release the medicine, then inhale deeply and slowly through your mouth (not your nose) until your lungs are completely filled. Inhaling should take 4-6 seconds. 10. Hold the medicine in your lungs for 5-10 seconds (10 seconds is best). This helps the medicine  get into the small airways of your lungs. 11. With your lips in a tight circle (pursed), breathe out slowly. 12. Repeat steps 3-11 until you have taken the number of puffs that your health care provider directed. Wait about 1 minute between puffs or as directed. 13. Remove the spacer from the inhaler and put the cap on the inhaler. 14. If you are using a steroid inhaler, rinse your mouth with water, gargle, and spit out the water. Do not swallow the water. Follow these instructions at home:  Take your inhaled medicine only as told by your health care provider. Do not use the inhaler more than directed by your health care provider.  Keep all follow-up visits as told by your health care provider. This is important.  If your inhaler has a counter, you can check it to determine how full your inhaler is. If your inhaler does not have a counter, ask your health care provider when you will need to refill your inhaler and write the refill date on a calendar or on your inhaler canister. Note that you cannot know when an inhaler is empty by shaking it.  Follow directions on the package insert for care and cleaning of your inhaler and spacer. Contact a health care provider if:  Symptoms are only partially relieved with your inhaler.  You are having trouble using your inhaler.  You have an increase in phlegm.  You have headaches. Get help right away if:  You feel little or no relief after using your inhaler.  You have dizziness.  You have a fast heart rate.  You have chills or a fever.  You have night sweats.  There is blood in your phlegm. Summary  A metered dose inhaler is a handheld device for taking medicine that must be breathed into the lungs (inhaled).  The medicine is delivered by pushing down on a metal canister to release a preset amount of spray and medicine.  Each device contains the amount of medicine that is needed for a preset number of uses (inhalations). This  information is not intended to replace advice given to you by your health care provider. Make sure you discuss any questions you have with your health care provider. Document Released: 12/03/2005 Document Revised: 10/23/2016 Document Reviewed: 10/23/2016 [employer] Interactive Patient Education  2017 [employer]  

## 2017-05-18 ENCOUNTER — Other Ambulatory Visit: Payer: Self-pay | Admitting: Family Medicine

## 2017-06-11 ENCOUNTER — Encounter: Payer: Self-pay | Admitting: Family Medicine

## 2017-06-11 ENCOUNTER — Ambulatory Visit (INDEPENDENT_AMBULATORY_CARE_PROVIDER_SITE_OTHER): Payer: Medicare Other | Admitting: Family Medicine

## 2017-06-11 VITALS — BP 110/64 | HR 68 | Temp 98.8°F | Ht 64.0 in | Wt 134.0 lb

## 2017-06-11 DIAGNOSIS — J0100 Acute maxillary sinusitis, unspecified: Secondary | ICD-10-CM | POA: Diagnosis not present

## 2017-06-11 DIAGNOSIS — J40 Bronchitis, not specified as acute or chronic: Secondary | ICD-10-CM

## 2017-06-11 MED ORDER — AMOXICILLIN-POT CLAVULANATE 875-125 MG PO TABS: 1.0000 | ORAL_TABLET | Freq: Two times a day (BID) | ORAL | 1 refills | Status: DC

## 2017-06-11 NOTE — Progress Notes (Signed)
Name: Kaitlyn NettersLinda Parker [name]   MRN: [id number]    DOB: 10/04/1946   Date:06/11/2017       Progress Note  Subjective  Chief Complaint  Chief Complaint  Patient presents with  . Sinus Problem    Pt stated coughing green mucus and ear problem 3 weeks    Sinus Problem  This is a new problem. The current episode started in the past 7 days. The problem has been gradually worsening since onset. There has been no fever. The pain is mild. Associated symptoms include chills, congestion, coughing, diaphoresis, sinus pressure and a sore throat. Pertinent negatives include no ear pain, headaches, hoarse voice, neck pain, shortness of breath, sneezing or swollen glands. Past treatments include nothing. The treatment provided mild relief.    No problem-specific Assessment & Plan notes found for this encounter.   Past Medical History:  Diagnosis Date  . Osteoarthritis   . Osteoporosis   . Thyroid disease     Past Surgical History:  Procedure Laterality Date  . TONSILLECTOMY    . TUBAL LIGATION      No family history on file.  Social History   Social History  . Marital status: Married    Spouse name: N/A  . Number of children: N/A  . Years of education: N/A   Occupational History  . Not on file.   Social History Main Topics  . Smoking status: Current Every Day Smoker    Packs/day: 1.00    Types: Cigarettes  . Smokeless tobacco: Never Used     Comment: discussed quiting with pt  . Alcohol use Yes  . Drug use: No  . Sexual activity: Not Currently   Other Topics Concern  . Not on file   Social History Narrative  . No narrative on file    Allergies  Allergen Reactions  . Sulfa Antibiotics     GI Bleeding    Outpatient Medications Prior to Visit  Medication Sig Dispense Refill  . albuterol (PROVENTIL HFA;VENTOLIN HFA) 108 (90 Base) MCG/ACT inhaler Inhale 2 puffs into the lungs every 6 (six) hours as needed for wheezing or shortness of breath. 1 Inhaler 0  . alendronate  (FOSAMAX) 70 MG tablet Take 1 tablet by mouth once a week. Dr Nelia ShiMorayarti    . Calcium Carb-Cholecalciferol (CALCIUM 600-D PO) Take by mouth.    Marland Kitchen. guaiFENesin-codeine (ROBITUSSIN AC) 100-10 MG/5ML syrup Take 5 mLs by mouth 3 (three) times daily as needed for cough. 150 mL 0  . Multiple Vitamin (MULTIVITAMIN) capsule Take 1 capsule by mouth daily.    Marland Kitchen. thyroid (ARMOUR) 60 MG tablet Take 60 mg by mouth daily before breakfast.    . Vitamin D, Ergocalciferol, (DRISDOL) 50000 units CAPS capsule Take 50,000 Units by mouth every 7 (seven) days. Dr Nelia ShiMorayarti    . amoxicillin-clavulanate (AUGMENTIN) 875-125 MG tablet Take 1 tablet by mouth 2 (two) times daily. 20 tablet 0  . doxycycline (VIBRA-TABS) 100 MG tablet TAKE ONE TABLET BY MOUTH TWICE DAILY. 14 tablet 0   No facility-administered medications prior to visit.     Review of Systems  Constitutional: Positive for chills and diaphoresis. Negative for fever, malaise/fatigue and weight loss.  HENT: Positive for congestion, sinus pressure and sore throat. Negative for ear discharge, ear pain, hoarse voice and sneezing.   Eyes: Negative for blurred vision.  Respiratory: Positive for cough. Negative for sputum production, shortness of breath and wheezing.   Cardiovascular: Negative for chest pain, palpitations and leg swelling.  Gastrointestinal: Negative  for abdominal pain, blood in stool, constipation, diarrhea, heartburn, melena and nausea.  Genitourinary: Negative for dysuria, frequency, hematuria and urgency.  Musculoskeletal: Negative for back pain, joint pain, myalgias and neck pain.  Skin: Negative for rash.  Neurological: Negative for dizziness, tingling, sensory change, focal weakness and headaches.  Endo/Heme/Allergies: Negative for environmental allergies and polydipsia. Does not bruise/bleed easily.  Psychiatric/Behavioral: Negative for depression and suicidal ideas. The patient is not nervous/anxious and does not have insomnia.       Objective  Vitals:   06/11/17 1109  BP: 110/64  Pulse: 68  Temp: 98.8 F (37.1 C)  Weight: 134 lb (60.8 kg)  Height: 5\' 4"  (1.626 m)    Physical Exam  Constitutional: She is well-developed, well-nourished, and in no distress. No distress.  HENT:  Head: Normocephalic and atraumatic.  Right Ear: External ear normal.  Left Ear: External ear normal.  Nose: Nose normal.  Mouth/Throat: Oropharynx is clear and moist.  Eyes: Conjunctivae and EOM are normal. Pupils are equal, round, and reactive to light. Right eye exhibits no discharge. Left eye exhibits no discharge.  Neck: Normal range of motion. Neck supple. No JVD present. No thyromegaly present.  Cardiovascular: Normal rate, regular rhythm, normal heart sounds and intact distal pulses.  Exam reveals no gallop and no friction rub.   No murmur heard. Pulmonary/Chest: Effort normal and breath sounds normal. She has no wheezes. She has no rales.  Abdominal: Soft. Bowel sounds are normal. She exhibits no mass. There is no tenderness. There is no guarding.  Musculoskeletal: Normal range of motion. She exhibits no edema.  Lymphadenopathy:    She has no cervical adenopathy.  Neurological: She is alert. She has normal reflexes.  Skin: Skin is warm and dry. She is not diaphoretic.  Psychiatric: Mood and affect normal.  Nursing note and vitals reviewed.     Assessment & Plan  Problem List Items Addressed This Visit    None    Visit Diagnoses    Acute non-recurrent maxillary sinusitis    -  Primary   Relevant Medications   amoxicillin-clavulanate (AUGMENTIN) 875-125 MG tablet   Bronchitis       Relevant Medications   amoxicillin-clavulanate (AUGMENTIN) 875-125 MG tablet   Acute recurrent maxillary sinusitis       Relevant Medications   amoxicillin-clavulanate (AUGMENTIN) 875-125 MG tablet      Meds ordered this encounter  Medications  . amoxicillin-clavulanate (AUGMENTIN) 875-125 MG tablet    Sig: Take 1 tablet by  mouth 2 (two) times daily.    Dispense:  20 tablet    Refill:  1      Dr. Elizabeth Sauer [facility]  06/11/17

## 2017-06-21 ENCOUNTER — Other Ambulatory Visit: Payer: Self-pay | Admitting: Family Medicine

## 2017-06-21 DIAGNOSIS — J40 Bronchitis, not specified as acute or chronic: Secondary | ICD-10-CM

## 2018-05-21 ENCOUNTER — Ambulatory Visit (INDEPENDENT_AMBULATORY_CARE_PROVIDER_SITE_OTHER): Payer: Medicare Other | Admitting: Family Medicine

## 2018-05-21 ENCOUNTER — Encounter: Payer: Self-pay | Admitting: Family Medicine

## 2018-05-21 VITALS — BP 110/80 | HR 72 | Ht 64.0 in | Wt 135.0 lb

## 2018-05-21 DIAGNOSIS — J0100 Acute maxillary sinusitis, unspecified: Secondary | ICD-10-CM | POA: Diagnosis not present

## 2018-05-21 DIAGNOSIS — F17200 Nicotine dependence, unspecified, uncomplicated: Secondary | ICD-10-CM

## 2018-05-21 MED ORDER — AZITHROMYCIN 250 MG PO TABS: ORAL_TABLET | ORAL | 0 refills | Status: DC

## 2018-05-21 MED ORDER — GUAIFENESIN-CODEINE 100-10 MG/5ML PO SYRP: 5.0000 mL | ORAL_SOLUTION | Freq: Three times a day (TID) | ORAL | 0 refills | Status: DC | PRN

## 2018-05-21 NOTE — Progress Notes (Signed)
Name: [name]   MRN: [id number]    DOB: 10-16-1946   Date:05/21/2018       Progress Note  Subjective  Chief Complaint  Chief Complaint  Patient presents with  . Sinusitis    slight sore throat, ear pressure, cough with green production    Sinusitis  This is a new problem. The current episode started in the past 7 days. The problem has been gradually worsening since onset. There has been no fever. Associated symptoms include congestion, coughing, sinus pressure and a sore throat. Pertinent negatives include no chills, diaphoresis, ear pain, headaches, hoarse voice, neck pain, shortness of breath, sneezing or swollen glands. Past treatments include oral decongestants. The treatment provided mild relief.    No problem-specific Assessment & Plan notes found for this encounter.   Past Medical History:  Diagnosis Date  . Osteoarthritis   . Osteoporosis   . Thyroid disease     Past Surgical History:  Procedure Laterality Date  . TONSILLECTOMY    . TUBAL LIGATION      History reviewed. No pertinent family history.  Social History   Socioeconomic History  . Marital status: Married    Spouse name: Not on file  . Number of children: Not on file  . Years of education: Not on file  . Highest education level: Not on file  Occupational History  . Not on file  Social Needs  . Financial resource strain: Not on file  . Food insecurity:    Worry: Not on file    Inability: Not on file  . Transportation needs:    Medical: Not on file    Non-medical: Not on file  Tobacco Use  . Smoking status: Current Every Day Smoker    Packs/day: 1.00    Types: Cigarettes  . Smokeless tobacco: Never Used  . Tobacco comment: discussed quiting with pt  Substance and Sexual Activity  . Alcohol use: Yes  . Drug use: No  . Sexual activity: Not Currently  Lifestyle  . Physical activity:    Days per week: Not on file    Minutes per session: Not on file  . Stress: Not on file   Relationships  . Social connections:    Talks on phone: Not on file    Gets together: Not on file    Attends religious service: Not on file    Active member of club or organization: Not on file    Attends meetings of clubs or organizations: Not on file    Relationship status: Not on file  . Intimate partner violence:    Fear of current or ex partner: Not on file    Emotionally abused: Not on file    Physically abused: Not on file    Forced sexual activity: Not on file  Other Topics Concern  . Not on file  Social History Narrative  . Not on file    Allergies  Allergen Reactions  . Sulfa Antibiotics     GI Bleeding    Outpatient Medications Prior to Visit  Medication Sig Dispense Refill  . alendronate (FOSAMAX) 70 MG tablet Take 1 tablet by mouth once a week. Dr Nelia Shi    . Calcium Carb-Cholecalciferol (CALCIUM 600-D PO) Take by mouth.    . Multiple Vitamin (MULTIVITAMIN) capsule Take 1 capsule by mouth daily.    Marland Kitchen thyroid (ARMOUR) 60 MG tablet Take 60 mg by mouth daily before breakfast.    . Vitamin D, Ergocalciferol, (DRISDOL) 50000 units CAPS capsule  Take 50,000 Units by mouth every 7 (seven) days. Dr Nelia ShiMorayarti    . albuterol (PROVENTIL HFA;VENTOLIN HFA) 108 (90 Base) MCG/ACT inhaler Inhale 2 puffs into the lungs every 6 (six) hours as needed for wheezing or shortness of breath. 1 Inhaler 0  . amoxicillin-clavulanate (AUGMENTIN) 875-125 MG tablet TAKE (1) TABLET BY MOUTH TWICE DAILY 20 tablet 0  . guaiFENesin-codeine (ROBITUSSIN AC) 100-10 MG/5ML syrup Take 5 mLs by mouth 3 (three) times daily as needed for cough. 150 mL 0   No facility-administered medications prior to visit.     Review of Systems  Constitutional: Negative for chills, diaphoresis, fever, malaise/fatigue and weight loss.  HENT: Positive for congestion, sinus pressure and sore throat. Negative for ear discharge, ear pain, hoarse voice and sneezing.   Eyes: Negative for blurred vision.  Respiratory:  Positive for cough. Negative for sputum production, shortness of breath and wheezing.   Cardiovascular: Negative for chest pain, palpitations and leg swelling.  Gastrointestinal: Negative for abdominal pain, blood in stool, constipation, diarrhea, heartburn, melena and nausea.  Genitourinary: Negative for dysuria, frequency, hematuria and urgency.  Musculoskeletal: Negative for back pain, joint pain, myalgias and neck pain.  Skin: Negative for rash.  Neurological: Negative for dizziness, tingling, sensory change, focal weakness and headaches.  Endo/Heme/Allergies: Negative for environmental allergies and polydipsia. Does not bruise/bleed easily.  Psychiatric/Behavioral: Negative for depression and suicidal ideas. The patient is not nervous/anxious and does not have insomnia.      Objective  Vitals:   05/21/18 1207  BP: 110/80  Pulse: 72  Weight: 135 lb (61.2 kg)  Height: 5\' 4"  (1.626 m)    Physical Exam  Constitutional: No distress.  HENT:  Head: Normocephalic and atraumatic.  Right Ear: External ear normal.  Left Ear: External ear normal.  Nose: Nose normal.  Mouth/Throat: Uvula is midline and oropharynx is clear and moist. No oropharyngeal exudate, posterior oropharyngeal edema or posterior oropharyngeal erythema.  Postnasal drainage  Eyes: Pupils are equal, round, and reactive to light. Conjunctivae and EOM are normal. Right eye exhibits no discharge. Left eye exhibits no discharge.  Neck: Normal range of motion. Neck supple. No JVD present. No thyromegaly present.  Cardiovascular: Normal rate, regular rhythm, normal heart sounds and intact distal pulses. Exam reveals no gallop and no friction rub.  No murmur heard. Pulmonary/Chest: Effort normal and breath sounds normal.  Abdominal: Soft. Bowel sounds are normal. She exhibits no mass. There is no tenderness. There is no guarding.  Musculoskeletal: Normal range of motion. She exhibits no edema.  Lymphadenopathy:    She has  no cervical adenopathy.  Neurological: She is alert. She has normal reflexes.  Skin: Skin is warm and dry. She is not diaphoretic.  Nursing note and vitals reviewed.     Assessment & Plan  Problem List Items Addressed This Visit    None    Visit Diagnoses    Acute maxillary sinusitis, recurrence not specified    -  Primary   gave antibiotic   Relevant Medications   azithromycin (ZITHROMAX) 250 MG tablet   guaiFENesin-codeine (ROBITUSSIN AC) 100-10 MG/5ML syrup   Tobacco dependence          Meds ordered this encounter  Medications  . azithromycin (ZITHROMAX) 250 MG tablet    Sig: 2 today then 1 a day for 4 days    Dispense:  6 tablet    Refill:  0  . guaiFENesin-codeine (ROBITUSSIN AC) 100-10 MG/5ML syrup    Sig: Take 5 mLs by  mouth 3 (three) times daily as needed for cough.    Dispense:  100 mL    Refill:  0      Dr. Elizabeth Sauer [facility]  05/21/18

## 2018-05-28 ENCOUNTER — Telehealth: Payer: Self-pay | Admitting: Family Medicine

## 2018-05-28 NOTE — Telephone Encounter (Signed)
Pt is on day 7 of 10 of this antibiotic. She takes med for 5 days, but it is acting in her system for 10. She needs to give it til Friday, then can call us if no better and Dr Yetta BarreJones may switch the antibiotic. Please call her. Thank you

## 2018-05-28 NOTE — Telephone Encounter (Signed)
Patient finished her z-pak and is wanting a different med. Her symptoms are still present.

## 2018-05-30 ENCOUNTER — Telehealth: Payer: Self-pay

## 2018-05-30 MED ORDER — AMOXICILLIN-POT CLAVULANATE 875-125 MG PO TABS: 1.0000 | ORAL_TABLET | Freq: Two times a day (BID) | ORAL | 0 refills | Status: DC

## 2018-05-30 NOTE — Telephone Encounter (Signed)
Pt called wanting to switch to Augmentin- sent to [facility][name]

## 2018-06-24 ENCOUNTER — Ambulatory Visit (INDEPENDENT_AMBULATORY_CARE_PROVIDER_SITE_OTHER): Payer: Medicare Other | Admitting: Family Medicine

## 2018-06-24 ENCOUNTER — Encounter: Payer: Self-pay | Admitting: Family Medicine

## 2018-06-24 VITALS — BP 120/70 | HR 68 | Ht 64.0 in | Wt 134.0 lb

## 2018-06-24 DIAGNOSIS — F17200 Nicotine dependence, unspecified, uncomplicated: Secondary | ICD-10-CM | POA: Diagnosis not present

## 2018-06-24 DIAGNOSIS — H1033 Unspecified acute conjunctivitis, bilateral: Secondary | ICD-10-CM | POA: Diagnosis not present

## 2018-06-24 DIAGNOSIS — H6123 Impacted cerumen, bilateral: Secondary | ICD-10-CM | POA: Diagnosis not present

## 2018-06-24 DIAGNOSIS — H6983 Other specified disorders of Eustachian tube, bilateral: Secondary | ICD-10-CM | POA: Diagnosis not present

## 2018-06-24 MED ORDER — PREDNISONE 10 MG PO TABS
10.0000 mg | ORAL_TABLET | Freq: Every day | ORAL | 0 refills | Status: DC
Start: 2018-06-24 — End: 2019-04-10

## 2018-06-24 MED ORDER — CARBAMIDE PEROXIDE 6.5 % OT SOLN: 5.0000 [drp] | Freq: Two times a day (BID) | OTIC | 0 refills | Status: DC

## 2018-06-24 MED ORDER — NEOMYCIN-POLYMYXIN-DEXAMETH 3.5-10000-0.1 OP SUSP: 1.0000 [drp] | Freq: Four times a day (QID) | OPHTHALMIC | 0 refills | Status: DC

## 2018-06-24 NOTE — Progress Notes (Signed)
Name: [name]   MRN: [id number]    DOB: 1946-07-26   Date:06/24/2018       Progress Note  Subjective  Chief Complaint  Chief Complaint  Patient presents with  . eye redness    matted shut in the am, ears "bothering me", cough with little green production    Conjunctivitis   The current episode started 2 days ago (sunday). The onset was sudden. The problem occurs continuously. The problem has been gradually improving. The problem is mild. Associated symptoms include eye itching, congestion, hearing loss, rhinorrhea, eye discharge, eye pain and eye redness. Pertinent negatives include no orthopnea, no fever, no decreased vision, no double vision, no photophobia, no ear discharge, no ear pain, no headaches, no mouth sores, no sore throat, no stridor, no swollen glands, no cough and no URI. The eye pain is mild. Both eyes are affected.   No problem-specific Assessment & Plan notes found for this encounter.   Past Medical History:  Diagnosis Date  . Osteoarthritis   . Osteoporosis   . Thyroid disease     Past Surgical History:  Procedure Laterality Date  . TONSILLECTOMY    . TUBAL LIGATION      No family history on file.  Social History   Socioeconomic History  . Marital status: Married    Spouse name: Not on file  . Number of children: Not on file  . Years of education: Not on file  . Highest education level: Not on file  Occupational History  . Not on file  Social Needs  . Financial resource strain: Not on file  . Food insecurity:    Worry: Not on file    Inability: Not on file  . Transportation needs:    Medical: Not on file    Non-medical: Not on file  Tobacco Use  . Smoking status: Current Every Day Smoker    Packs/day: 1.00    Types: Cigarettes  . Smokeless tobacco: Never Used  . Tobacco comment: discussed quiting with pt  Substance and Sexual Activity  . Alcohol use: Yes  . Drug use: No  . Sexual activity: Not Currently  Lifestyle  . Physical  activity:    Days per week: Not on file    Minutes per session: Not on file  . Stress: Not on file  Relationships  . Social connections:    Talks on phone: Not on file    Gets together: Not on file    Attends religious service: Not on file    Active member of club or organization: Not on file    Attends meetings of clubs or organizations: Not on file    Relationship status: Not on file  . Intimate partner violence:    Fear of current or ex partner: Not on file    Emotionally abused: Not on file    Physically abused: Not on file    Forced sexual activity: Not on file  Other Topics Concern  . Not on file  Social History Narrative  . Not on file    Allergies  Allergen Reactions  . Sulfa Antibiotics     GI Bleeding    Outpatient Medications Prior to Visit  Medication Sig Dispense Refill  . alendronate (FOSAMAX) 70 MG tablet Take 1 tablet by mouth once a week. Dr Nelia Shi    . Calcium Carb-Cholecalciferol (CALCIUM 600-D PO) Take by mouth.    . fluticasone (FLONASE) 50 MCG/ACT nasal spray Place 2 sprays into both nostrils 2 (  two) times daily.    . Multiple Vitamin (MULTIVITAMIN) capsule Take 1 capsule by mouth daily.    Marland Kitchen. thyroid (ARMOUR) 60 MG tablet Take 60 mg by mouth daily before breakfast.    . Vitamin D, Ergocalciferol, (DRISDOL) 50000 units CAPS capsule Take 50,000 Units by mouth every 7 (seven) days. Dr Nelia ShiMorayarti    . amoxicillin-clavulanate (AUGMENTIN) 875-125 MG tablet Take 1 tablet by mouth 2 (two) times daily. 20 tablet 0  . azithromycin (ZITHROMAX) 250 MG tablet 2 today then 1 a day for 4 days 6 tablet 0  . guaiFENesin-codeine (ROBITUSSIN AC) 100-10 MG/5ML syrup Take 5 mLs by mouth 3 (three) times daily as needed for cough. 100 mL 0   No facility-administered medications prior to visit.     Review of Systems  Constitutional: Negative for fever.  HENT: Positive for congestion, hearing loss and rhinorrhea. Negative for ear discharge, ear pain, mouth sores and sore  throat.   Eyes: Positive for pain, discharge, redness and itching. Negative for double vision and photophobia.  Respiratory: Negative for cough and stridor.   Cardiovascular: Negative for orthopnea.  Neurological: Negative for headaches.     Objective  Vitals:   06/24/18 1526  BP: 120/70  Pulse: 68  Weight: 134 lb (60.8 kg)  Height: 5\' 4"  (1.626 m)    Physical Exam  Constitutional: She is oriented to person, place, and time. She appears well-developed and well-nourished.  HENT:  Head: Normocephalic.  Right Ear: Hearing and external ear normal.  Left Ear: Hearing and external ear normal.  Nose: Nose normal.  Mouth/Throat: Uvula is midline and oropharynx is clear and moist. No oropharyngeal exudate, posterior oropharyngeal edema or posterior oropharyngeal erythema.  Bilateral eustachian tube dysfunction  Eyes: Pupils are equal, round, and reactive to light. Conjunctivae and EOM are normal. Lids are everted and swept, no foreign bodies found. Left eye exhibits no hordeolum. No foreign body present in the left eye. Right conjunctiva is not injected. Left conjunctiva is not injected. No scleral icterus.  Neck: Normal range of motion. Neck supple. No JVD present. No tracheal deviation present. No thyromegaly present.  Cardiovascular: Normal rate, regular rhythm, normal heart sounds and intact distal pulses. Exam reveals no gallop and no friction rub.  No murmur heard. Pulmonary/Chest: Effort normal and breath sounds normal. No respiratory distress. She has no wheezes. She has no rales.  Abdominal: Soft. Bowel sounds are normal. She exhibits no mass. There is no hepatosplenomegaly. There is no tenderness. There is no rebound and no guarding.  Musculoskeletal: Normal range of motion. She exhibits no edema or tenderness.  Lymphadenopathy:    She has no cervical adenopathy.  Neurological: She is alert and oriented to person, place, and time. She has normal strength. She displays normal  reflexes. No cranial nerve deficit.  Skin: Skin is warm. No rash noted.  Psychiatric: She has a normal mood and affect. Her mood appears not anxious. She does not exhibit a depressed mood.  Nursing note and vitals reviewed.     Assessment & Plan  Problem List Items Addressed This Visit    None    Visit Diagnoses    Acute conjunctivitis of both eyes, unspecified acute conjunctivitis type    -  Primary   prescribed pred taper and maxitrol drops   Tobacco dependence       discussed smoking cessation   Eustachian tube dysfunction, bilateral       prescribed pred taper   Bilateral impacted cerumen  pick up Debrox ear drops otc      Meds ordered this encounter  Medications  . carbamide peroxide (DEBROX) 6.5 % OTIC solution    Sig: Place 5 drops into both ears 2 (two) times daily.    Dispense:  15 mL    Refill:  0  . predniSONE (DELTASONE) 10 MG tablet    Sig: Take 1 tablet (10 mg total) by mouth daily with breakfast.    Dispense:  30 tablet    Refill:  0    Taper 4,4,4,3,3,3,2,2,2,1,1,1  . neomycin-polymyxin b-dexamethasone (MAXITROL) 3.5-10000-0.1 SUSP    Sig: Place 1 drop into both eyes every 6 (six) hours.    Dispense:  5 mL    Refill:  0      Dr. Hayden [facility]  06/24/18

## 2019-04-10 ENCOUNTER — Ambulatory Visit (INDEPENDENT_AMBULATORY_CARE_PROVIDER_SITE_OTHER): Payer: Medicare Other | Admitting: Family Medicine

## 2019-04-10 ENCOUNTER — Other Ambulatory Visit: Payer: Self-pay

## 2019-04-10 ENCOUNTER — Encounter: Payer: Self-pay | Admitting: Family Medicine

## 2019-04-10 VITALS — BP 120/70 | HR 80 | Ht 64.0 in | Wt 134.0 lb

## 2019-04-10 DIAGNOSIS — J432 Centrilobular emphysema: Secondary | ICD-10-CM | POA: Insufficient documentation

## 2019-04-10 DIAGNOSIS — H1033 Unspecified acute conjunctivitis, bilateral: Secondary | ICD-10-CM

## 2019-04-10 DIAGNOSIS — Z23 Encounter for immunization: Secondary | ICD-10-CM

## 2019-04-10 DIAGNOSIS — J0100 Acute maxillary sinusitis, unspecified: Secondary | ICD-10-CM

## 2019-04-10 MED ORDER — NEOMYCIN-POLYMYXIN-DEXAMETH 3.5-10000-0.1 OP SUSP: 1.0000 [drp] | Freq: Four times a day (QID) | OPHTHALMIC | 0 refills | Status: DC

## 2019-04-10 MED ORDER — AMOXICILLIN-POT CLAVULANATE 875-125 MG PO TABS: 1.0000 | ORAL_TABLET | Freq: Two times a day (BID) | ORAL | 0 refills | Status: DC

## 2019-04-10 NOTE — Progress Notes (Signed)
Date:  04/10/2019   Name:  Kaitlyn NettersLinda Parker Stefanik   DOB:  05/03/1946   MRN:  [id number]   Chief Complaint: Sinusitis (cough and cong/ no fever/ no travel, had a headache- using flonase for nasal drainage, eyes matted up)  Sinusitis  This is a new problem. The current episode started more than 1 month ago (friday after christmas). The problem is unchanged. There has been no fever. The fever has been present for 1 to 2 days. The pain is mild. Associated symptoms include congestion, coughing, headaches and sinus pressure. Pertinent negatives include no chills, diaphoresis, ear pain, hoarse voice, neck pain, shortness of breath, sneezing, sore throat or swollen glands. Treatments tried: ztrtec/flnase. The treatment provided no relief.    Review of Systems  Constitutional: Positive for fatigue. Negative for chills, diaphoresis, fever and unexpected weight change.  HENT: Positive for congestion, postnasal drip, rhinorrhea, sinus pressure and sinus pain. Negative for ear discharge, ear pain, facial swelling, hoarse voice, sneezing and sore throat.   Eyes: Negative for photophobia, pain, discharge, redness and itching.  Respiratory: Positive for cough and wheezing. Negative for shortness of breath and stridor.   Gastrointestinal: Negative for abdominal pain, blood in stool, constipation, diarrhea, nausea and vomiting.  Endocrine: Negative for cold intolerance, heat intolerance, polydipsia, polyphagia and polyuria.  Genitourinary: Negative for dysuria, flank pain, frequency, hematuria, menstrual problem, pelvic pain, urgency, vaginal bleeding and vaginal discharge.  Musculoskeletal: Negative for arthralgias, back pain, myalgias and neck pain.  Skin: Negative for rash.  Allergic/Immunologic: Negative for environmental allergies and food allergies.  Neurological: Positive for headaches. Negative for dizziness, weakness, light-headedness and numbness.  Hematological: Negative for adenopathy. Does not  bruise/bleed easily.  Psychiatric/Behavioral: Negative for dysphoric mood. The patient is not nervous/anxious.     There are no active problems to display for this patient.   Allergies  Allergen Reactions  . Sulfa Antibiotics     GI Bleeding    Past Surgical History:  Procedure Laterality Date  . TONSILLECTOMY    . TUBAL LIGATION      Social History   Tobacco Use  . Smoking status: Current Every Day Smoker    Packs/day: 1.00    Types: Cigarettes  . Smokeless tobacco: Never Used  . Tobacco comment: discussed quiting with pt  Substance Use Topics  . Alcohol use: Yes  . Drug use: No     Medication list has been reviewed and updated.  Current Meds  Medication Sig  . alendronate (FOSAMAX) 70 MG tablet Take 1 tablet by mouth once a week. Dr Nelia ShiMorayarti  . Calcium Carb-Cholecalciferol (CALCIUM 600-D PO) Take by mouth.  . carbamide peroxide (DEBROX) 6.5 % OTIC solution Place 5 drops into both ears 2 (two) times daily.  . fluticasone (FLONASE) 50 MCG/ACT nasal spray Place 2 sprays into both nostrils 2 (two) times daily.  . Multiple Vitamin (MULTIVITAMIN) capsule Take 1 capsule by mouth daily.  Marland Kitchen. thyroid (ARMOUR) 60 MG tablet Take 60 mg by mouth daily before breakfast.  . Vitamin D, Ergocalciferol, (DRISDOL) 50000 units CAPS capsule Take 50,000 Units by mouth every 7 (seven) days. Dr Nelia ShiMorayarti  . [DISCONTINUED] neomycin-polymyxin b-dexamethasone (MAXITROL) 3.5-10000-0.1 SUSP Place 1 drop into both eyes every 6 (six) hours.    PHQ 2/9 Scores 05/21/2018 04/09/2017  PHQ - 2 Score 0 0  PHQ- 9 Score 3 -    BP Readings from Last 3 Encounters:  04/10/19 120/70  06/24/18 120/70  05/21/18 110/80    Physical Exam Vitals  signs and nursing note reviewed.  Constitutional:      General: She is not in acute distress.    Appearance: She is not diaphoretic.  HENT:     Head: Normocephalic and atraumatic.     Right Ear: Tympanic membrane, ear canal and external ear normal.     Left  Ear: Tympanic membrane, ear canal and external ear normal.     Nose: Congestion present. No rhinorrhea.  Eyes:     General:        Right eye: No discharge.        Left eye: No discharge.     Extraocular Movements: Extraocular movements intact.     Conjunctiva/sclera: Conjunctivae normal.     Pupils: Pupils are equal, round, and reactive to light.  Neck:     Musculoskeletal: Normal range of motion and neck supple.     Thyroid: No thyromegaly.     Vascular: No JVD.  Cardiovascular:     Rate and Rhythm: Normal rate and regular rhythm.     Heart sounds: Normal heart sounds. No murmur. No friction rub. No gallop.   Pulmonary:     Effort: Pulmonary effort is normal.     Breath sounds: Normal breath sounds. No wheezing, rhonchi or rales.  Abdominal:     General: Bowel sounds are normal.     Palpations: Abdomen is soft. There is no mass.     Tenderness: There is no abdominal tenderness. There is no guarding.  Musculoskeletal: Normal range of motion.  Lymphadenopathy:     Cervical: No cervical adenopathy.  Skin:    General: Skin is warm and dry.  Neurological:     Mental Status: She is alert.     Deep Tendon Reflexes: Reflexes are normal and symmetric.     Wt Readings from Last 3 Encounters:  04/10/19 134 lb (60.8 kg)  06/24/18 134 lb (60.8 kg)  05/21/18 135 lb (61.2 kg)    BP 120/70   Pulse 80   Ht 5\' 4"  (1.626 m)   Wt 134 lb (60.8 kg)   SpO2 99%   BMI 23.00 kg/m   Assessment and Plan: 1. Acute maxillary sinusitis, recurrence not specified Acute.  Persistent.  Initiate Augmentin 875 twice a day for 10 days. - amoxicillin-clavulanate (AUGMENTIN) 875-125 MG tablet; Take 1 tablet by mouth 2 (two) times daily.  Dispense: 20 tablet; Refill: 0  2. Centrilobular emphysema (HCC) Patient with history of centrilobular emphysema for which she is currently under a relatively stable dialysis.  Chronic.  Controlled.  Continue inhaler as needed.  3. Acute bacterial conjunctivitis of  both eyes Allergic conjunctivitis secondary to the pollen count with the possibility of a bacterial component given the course of several days.  Will reinitiate the above ophthalmic drops which have been successful in the past - neomycin-polymyxin b-dexamethasone (MAXITROL) 3.5-10000-0.1 SUSP; Place 1 drop into both eyes every 6 (six) hours.  Dispense: 5 mL; Refill: 0  4. Need for vaccination against Streptococcus pneumoniae using pneumococcal conjugate vaccine 13 Patient was noted that she needed a Pneumovax 13, this was administered day. - Pneumococcal conjugate vaccine 13-valent IM

## 2019-12-14 ENCOUNTER — Other Ambulatory Visit: Payer: Self-pay | Admitting: Family Medicine

## 2019-12-21 ENCOUNTER — Other Ambulatory Visit: Payer: Self-pay

## 2019-12-21 ENCOUNTER — Encounter: Payer: Self-pay | Admitting: Family Medicine

## 2019-12-21 ENCOUNTER — Ambulatory Visit (INDEPENDENT_AMBULATORY_CARE_PROVIDER_SITE_OTHER): Payer: Medicare Other | Admitting: Family Medicine

## 2019-12-21 VITALS — BP 130/60 | HR 64 | Ht 64.0 in | Wt 135.0 lb

## 2019-12-21 DIAGNOSIS — J3089 Other allergic rhinitis: Secondary | ICD-10-CM | POA: Diagnosis not present

## 2019-12-21 DIAGNOSIS — J432 Centrilobular emphysema: Secondary | ICD-10-CM | POA: Diagnosis not present

## 2019-12-21 DIAGNOSIS — F17200 Nicotine dependence, unspecified, uncomplicated: Secondary | ICD-10-CM | POA: Diagnosis not present

## 2019-12-21 MED ORDER — ALBUTEROL SULFATE HFA 108 (90 BASE) MCG/ACT IN AERS: 2.0000 | INHALATION_SPRAY | Freq: Four times a day (QID) | RESPIRATORY_TRACT | 11 refills | Status: DC | PRN

## 2019-12-21 MED ORDER — FLUTICASONE-SALMETEROL 250-50 MCG/DOSE IN AEPB: 1.0000 | INHALATION_SPRAY | Freq: Two times a day (BID) | RESPIRATORY_TRACT | 6 refills | Status: DC

## 2019-12-21 MED ORDER — FLUTICASONE PROPIONATE 50 MCG/ACT NA SUSP: 2.0000 | Freq: Two times a day (BID) | NASAL | 11 refills | Status: AC

## 2019-12-21 NOTE — Progress Notes (Signed)
Date:  12/21/2019   Name:  [name]   DOB:  1946-05-02   MRN:  [id number]   Chief Complaint: Allergic Rhinitis  and COPD  COPD She complains of shortness of breath and wheezing. There is no chest tightness, cough, difficulty breathing, frequent throat clearing, hemoptysis, hoarse voice or sputum production. This is a chronic problem. The current episode started more than 1 year ago. The problem occurs intermittently. The problem has been waxing and waning. Pertinent negatives include no appetite change, chest pain, dyspnea on exertion, ear congestion, ear pain, fever, headaches, heartburn, malaise/fatigue, myalgias, nasal congestion, orthopnea, PND, postnasal drip, rhinorrhea, sneezing, sore throat, sweats, trouble swallowing or weight loss. Her symptoms are aggravated by pollen (shrimp). Her symptoms are alleviated by beta-agonist and steroid inhaler. She reports moderate improvement on treatment. Her past medical history is significant for COPD.    No results found for: CREATININE, BUN, NA, K, CL, CO2 No results found for: CHOL, HDL, LDLCALC, LDLDIRECT, TRIG, CHOLHDL No results found for: TSH No results found for: HGBA1C   Review of Systems  Constitutional: Negative for appetite change, chills, fever, malaise/fatigue and weight loss.  HENT: Negative for drooling, ear discharge, ear pain, hoarse voice, postnasal drip, rhinorrhea, sinus pressure, sneezing, sore throat and trouble swallowing.   Respiratory: Positive for shortness of breath and wheezing. Negative for cough, hemoptysis, sputum production, choking and chest tightness.   Cardiovascular: Negative for chest pain, dyspnea on exertion, palpitations, leg swelling and PND.  Gastrointestinal: Negative for abdominal pain, blood in stool, constipation, diarrhea, heartburn and nausea.  Endocrine: Negative for polydipsia.  Genitourinary: Negative for dysuria, frequency, hematuria and urgency.  Musculoskeletal: Negative for back  pain, myalgias and neck pain.  Skin: Negative for rash.  Allergic/Immunologic: Negative for environmental allergies.  Neurological: Negative for dizziness and headaches.  Hematological: Does not bruise/bleed easily.  Psychiatric/Behavioral: Negative for suicidal ideas. The patient is not nervous/anxious.     Patient Active Problem List   Diagnosis Date Noted  . Centrilobular emphysema ([place]) 04/10/2019    Allergies  Allergen Reactions  . Sulfa Antibiotics     GI Bleeding    Past Surgical History:  Procedure Laterality Date  . TONSILLECTOMY    . TUBAL LIGATION      Social History   Tobacco Use  . Smoking status: Current Every Day Smoker    Packs/day: 1.00    Types: Cigarettes  . Smokeless tobacco: Never Used  . Tobacco comment: discussed quiting with pt  Substance Use Topics  . Alcohol use: Yes  . Drug use: No     Medication list has been reviewed and updated.  Current Meds  Medication Sig  . ADVAIR DISKUS 250-50 MCG/DOSE AEPB INHALE ONE PUFF TWICE DAILY  . alendronate (FOSAMAX) 70 MG tablet Take 1 tablet by mouth once a week. Dr Truddie Coco  . Calcium Carb-Cholecalciferol (CALCIUM 600-D PO) Take by mouth.  . fluticasone (FLONASE) 50 MCG/ACT nasal spray Place 2 sprays into both nostrils 2 (two) times daily.  . Multiple Vitamin (MULTIVITAMIN) capsule Take 1 capsule by mouth daily.  Marland Kitchen thyroid (ARMOUR) 60 MG tablet Take 60 mg by mouth daily before breakfast.  . Vitamin D, Ergocalciferol, (DRISDOL) 50000 units CAPS capsule Take 50,000 Units by mouth every 7 (seven) days. Dr Truddie Coco    [facility] 2/9 Scores 12/21/2019 05/21/2018 04/09/2017  PHQ - 2 Score 1 0 0  PHQ- 9 Score 5 3 -    BP Readings from Last 3 Encounters:  12/21/19 130/60  04/10/19 120/70  06/24/18 120/70    Physical Exam Vitals and nursing note reviewed.  Constitutional:      General: She is not in acute distress.    Appearance: She is not diaphoretic.  HENT:     Head: Normocephalic and atraumatic.      Right Ear: Tympanic membrane and external ear normal. There is no impacted cerumen.     Left Ear: Tympanic membrane and external ear normal. There is no impacted cerumen.     Nose: Nose normal. No congestion or rhinorrhea.  Eyes:     General:        Right eye: No discharge.        Left eye: No discharge.     Conjunctiva/sclera: Conjunctivae normal.     Pupils: Pupils are equal, round, and reactive to light.  Neck:     Thyroid: No thyromegaly.     Vascular: No JVD.  Cardiovascular:     Rate and Rhythm: Normal rate and regular rhythm.     Heart sounds: Normal heart sounds. No murmur. No friction rub. No gallop.   Pulmonary:     Effort: Pulmonary effort is normal.     Breath sounds: Normal breath sounds. No transmitted upper airway sounds. No decreased breath sounds, wheezing, rhonchi or rales.  Abdominal:     General: Bowel sounds are normal.     Palpations: Abdomen is soft. There is no mass.     Tenderness: There is no abdominal tenderness. There is no guarding.  Musculoskeletal:        General: Normal range of motion.     Cervical back: Normal range of motion and neck supple.  Lymphadenopathy:     Cervical: No cervical adenopathy.  Skin:    General: Skin is warm and dry.  Neurological:     Mental Status: She is alert.     Deep Tendon Reflexes: Reflexes are normal and symmetric.     Wt Readings from Last 3 Encounters:  12/21/19 135 lb (61.2 kg)  04/10/19 134 lb (60.8 kg)  06/24/18 134 lb (60.8 kg)    BP 130/60   Pulse 64   Ht 5\' 4"  (1.626 m)   Wt 135 lb (61.2 kg)   BMI 23.17 kg/m   Assessment and Plan: 1. Centrilobular emphysema (HCC) Chronic.  Controlled.  Stable.  Patient will continue Advair 251 puff twice a day with albuterol HFA as needed rescue concern. - albuterol (VENTOLIN HFA) 108 (90 Base) MCG/ACT inhaler; Inhale 2 puffs into the lungs every 6 (six) hours as needed for wheezing or shortness of breath.  Dispense: 8 g; Refill: 11  2. Tobacco  dependence Patient has been advised of the health risks of smoking and counseled concerning cessation of tobacco products. I spent over 3 minutes for discussion and to answer questions.  We had a long discussion about patient's continued smoking and that decision at some point times cannot be necessitated rather than negotiated.  Patient will be given some consideration to this and realizes that this is our priority in the next couple of months.  In the meantime patient has been offered a spiral CT but she is reluctant to do it unless it can be done in [place] we will discuss this on her next visit.  3. Non-seasonal allergic rhinitis, unspecified trigger Patient has allergic rhinitis which is episodic.  Stable.  Will refill fluticasone nasal spray 1 puff in each nostril once a day.

## 2020-10-07 ENCOUNTER — Telehealth: Payer: Self-pay | Admitting: Family Medicine

## 2020-10-07 NOTE — Telephone Encounter (Signed)
Left message for patient to call back and schedule Medicare Annual Wellness Visit (AWV) either virtually/audio only or in office. Whichever the patients preference is.  No history of AWV; please schedule at anytime with [facility]-Nurse Health Advisor.  This should be a 40 minute visit 

## 2021-05-25 ENCOUNTER — Other Ambulatory Visit: Payer: Self-pay

## 2021-05-25 ENCOUNTER — Ambulatory Visit (INDEPENDENT_AMBULATORY_CARE_PROVIDER_SITE_OTHER): Payer: Medicare Other | Admitting: Family Medicine

## 2021-05-25 ENCOUNTER — Telehealth: Payer: Self-pay

## 2021-05-25 ENCOUNTER — Encounter: Payer: Self-pay | Admitting: Family Medicine

## 2021-05-25 VITALS — BP 110/70 | HR 80 | Ht 64.0 in | Wt 136.0 lb

## 2021-05-25 DIAGNOSIS — L03116 Cellulitis of left lower limb: Secondary | ICD-10-CM

## 2021-05-25 DIAGNOSIS — I739 Peripheral vascular disease, unspecified: Secondary | ICD-10-CM

## 2021-05-25 DIAGNOSIS — F17200 Nicotine dependence, unspecified, uncomplicated: Secondary | ICD-10-CM | POA: Diagnosis not present

## 2021-05-25 NOTE — Telephone Encounter (Signed)
Called with wound care clinic appt 05/30/21 @ 10:00 in [place]

## 2021-05-25 NOTE — Progress Notes (Signed)
1. Cellulitis of left lower extremity New onset.  Persistent.  Patient's had 2 1 sonometer ulcerations secondary to what is presumed a tick bite since May 23.  Patient has been on amoxicillin and is currently on clindamycin for an oral infection.  On examination there is an eschar that is in the base of both which probably needs to be debrided and patient is placed on a wound care oriented regimen.  Referral to wound care we have discussed him working to try to get her in in the next coming days since this is been going on since May and patient's been on antibiotics by an external source. - AMB referral to wound care center  2. 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  Not documented there is arterial insufficiency I am able to palpate a posterior tibial but I cannot palpate a dorsalis pedis on either foot given her extensive smoking history I would not be surprised that there is some degree of arterial insufficiency.  Patient's been once again encouraged to quit smoking.  We will check a fingerstick glucose to make sure that there is no elevation greater than 200 at this time  3. Tobacco dependence Patient has been advised of the health risks of smoking and counseled concerning cessation of tobacco products. I spent over 3 minutes for discussion and to answer questions.

## 2021-05-30 ENCOUNTER — Other Ambulatory Visit: Payer: Self-pay

## 2021-05-30 ENCOUNTER — Encounter: Payer: Medicare Other | Attending: Physician Assistant | Admitting: Physician Assistant

## 2021-05-30 DIAGNOSIS — J439 Emphysema, unspecified: Secondary | ICD-10-CM | POA: Insufficient documentation

## 2021-05-30 DIAGNOSIS — L97822 Non-pressure chronic ulcer of other part of left lower leg with fat layer exposed: Secondary | ICD-10-CM | POA: Insufficient documentation

## 2021-05-30 DIAGNOSIS — I872 Venous insufficiency (chronic) (peripheral): Secondary | ICD-10-CM | POA: Insufficient documentation

## 2021-05-30 DIAGNOSIS — L299 Pruritus, unspecified: Secondary | ICD-10-CM | POA: Diagnosis not present

## 2021-05-31 NOTE — Progress Notes (Signed)
JAZMA, PICKEL ([id number]) Visit Report for 05/30/2021 Allergy List Details Patient Name: [name], [name]. Date of Service: 05/30/2021 10:00 AM Medical Record Number: [id number] Patient Account Number: [id number] Date of Birth/Sex: 30-Jan-1946 (75 y.o. Female) Treating [name]: [name] Primary Care [name]: [name] Other Clinician: Referring [name]: [name] Treating [name]/Extender: [name] Weeks in Treatment: 0 Allergies Active Allergies Sulfa (Sulfonamide Antibiotics) shrimp Allergy Notes Electronic Signature(s) Signed: 05/31/2021 3:34:38 PM By: Kaitlyn Coria [name] Entered By: [name] on 05/30/2021 10:13:06 [name], [name] ([id number]) -------------------------------------------------------------------------------- Arrival Information Details Patient Name: [name], [name] Date of Service: 05/30/2021 10:00 AM Medical Record Number: [id number] Patient Account Number: [id number] Date of Birth/Sex: September 03, 1946 (75 y.o. Female) Treating [name]: [name] Primary Care Anieya Helman: [name] Other Clinician: Referring [name]: [name] Treating [name]: [name] in Treatment: 0 Visit Information Patient Arrived: Ambulatory Arrival Time: 10:08 Accompanied By: self Transfer Assistance: None Patient Identification Verified: Yes Secondary Verification Process Completed: Yes Patient Requires Transmission-Based Precautions: No Patient Has Alerts: No Electronic Signature(s) Signed: 05/31/2021 3:34:38 PM By: Kaitlyn Coria [name] Entered By: [name] on 05/30/2021 10:09:25 [name], [name] ([id number]) -------------------------------------------------------------------------------- Clinic Level of Care Assessment Details Patient Name: [name], [name] Date of Service: 05/30/2021 10:00 AM Medical Record Number: [id number] Patient Account Number: [id number] Date of Birth/Sex: [birth year] (75 y.o. Female) Treating [name]: Dolan Amen Primary Care [name]:  [name] Other Clinician: Referring Chenoah Mcnally: [name] Treating [name]/Extender: [name] in Treatment: 0 Clinic Level of Care Assessment Items TOOL 1 Quantity Score X - Use when EandM and Procedure is performed on INITIAL visit 1 0 ASSESSMENTS - Nursing Assessment / Reassessment X - General Physical Exam (combine w/ comprehensive assessment (listed just below) when performed on new 1 20 pt. evals) X- 1 25 Comprehensive Assessment (HX, ROS, Risk Assessments, Wounds Hx, etc.) ASSESSMENTS - Wound and Skin Assessment / Reassessment []  - Dermatologic / Skin Assessment (not related to wound area) 0 ASSESSMENTS - Ostomy and/or Continence Assessment and Care []  - Incontinence Assessment and Management 0 []  - 0 Ostomy Care Assessment and Management (repouching, etc.) PROCESS - Coordination of Care X - Simple Patient / Family Education for ongoing care 1 15 []  - 0 Complex (extensive) Patient / Family Education for ongoing care X- 1 10 Staff obtains Programmer, systems, Records, Test Results / Process Orders []  - 0 Staff telephones HHA, Nursing Homes / Clarify orders / etc []  - 0 Routine Transfer to another Facility (non-emergent condition) []  - 0 Routine Hospital Admission (non-emergent condition) []  - 0 New Admissions / Biomedical engineer / Ordering NPWT, Apligraf, etc. []  - 0 Emergency Hospital Admission (emergent condition) PROCESS - Special Needs []  - Pediatric / Minor Patient Management 0 []  - 0 Isolation Patient Management []  - 0 Hearing / Language / Visual special needs []  - 0 Assessment of Community assistance (transportation, D/C planning, etc.) []  - 0 Additional assistance / Altered mentation []  - 0 Support Surface(s) Assessment (bed, cushion, seat, etc.) INTERVENTIONS - Miscellaneous []  - External ear exam 0 []  - 0 Patient Transfer (multiple staff / Civil Service fast streamer / Similar devices) []  - 0 Simple Staple / Suture removal (25 or less) []  - 0 Complex  Staple / Suture removal (26 or more) []  - 0 Hypo/Hyperglycemic Management (do not check if billed separately) X- 1 15 Ankle / Brachial Index (ABI) - do not check if billed separately Has the patient been seen at the hospital within the last three years: Yes Total Score: 85 Level Of  Care: New/Established - Level 3 [name] ([id number]) Electronic Signature(s) Signed: 05/30/2021 4:32:42 PM By: [name] Entered By: Kaitlyn Mouse, Minus Breeding on 05/30/2021 10:57:08 [name], [name] ([id number]) -------------------------------------------------------------------------------- Encounter Discharge Information Details Patient Name: [name], [name] Date of Service: 05/30/2021 10:00 AM Medical Record Number: [id number] Patient Account Number: [id number] Date of Birth/Sex: [birth year] (75 y.o. Female) Treating [name]: Donnamarie Poag Primary Care Mahitha Hickling: [name] Other Clinician: Referring Hutton Pellicane: [name] Treating Devlin Brink/Extender: [name] in Treatment: 0 Encounter Discharge Information Items Post Procedure Vitals Discharge Condition: Stable Temperature (F): 97.8 Ambulatory Status: Ambulatory Pulse (bpm): 72 Discharge Destination: Home Respiratory Rate (breaths/min): 18 Transportation: Private Auto Blood Pressure (mmHg): 125/74 Accompanied By: self Schedule Follow-up Appointment: Yes Clinical Summary of Care: Electronic Signature(s) Signed: 05/30/2021 4:29:31 PM By: Donnamarie Poag Entered By: Donnamarie Poag on 05/30/2021 11:10:47 [name], [name] ([id number]) -------------------------------------------------------------------------------- Lower Extremity Assessment Details Patient Name: [name], [name] Date of Service: 05/30/2021 10:00 AM Medical Record Number: [id number] Patient Account Number: [id number] Date of Birth/Sex: Jul 30, 1946 (75 y.o. Female) Treating [name]: [name] Primary Care Raciel Caffrey: [name] Other Clinician: Referring Abdalrahman Clementson: [name] Treating Saprina Chuong/Extender: [name] Weeks in Treatment: 0 Edema Assessment Assessed: [Left: No] [Right: No] Edema: [Left: Ye] [Right: s] Calf Left: Right: Point of Measurement: 33 cm From Medial Instep 34 cm Ankle Left: Right: Point of Measurement: 10 cm From Medial Instep 20 cm Knee To [name] Left: Right: From Medial Instep 46 cm Vascular Assessment Pulses: Dorsalis Pedis Doppler Audible: [Left:Yes] Blood Pressure: Brachial: [Left:125] Ankle: [Left:Dorsalis Pedis: 132 1.06] Electronic Signature(s) Signed: 05/31/2021 3:34:38 PM By: Kaitlyn Coria [name] Entered By: [name] on 05/30/2021 10:31:58 Bushey, Leonard P. ([id number]) -------------------------------------------------------------------------------- Multi Wound Chart Details Patient Name: [name], [name] Date of Service: 05/30/2021 10:00 AM Medical Record Number: [id number] Patient Account Number: [id number] Date of Birth/Sex: [birth year] (75 y.o. Female) Treating [name]: Dolan Amen Primary Care Holman Bonsignore: [name] Other Clinician: Referring Selah Klang: [name] Treating Shaquala Broeker/Extender: [name] Weeks in Treatment: 0 Vital Signs Height(in): 52 Pulse(bpm): 47 Weight(lbs): 135 Blood Pressure(mmHg): 125/74 Body Mass Index(BMI): 23 Temperature(F): 98.5 Respiratory Rate(breaths/min): 18 Photos: [N/A:N/A] Wound Location: Left, Medial Lower Leg Left, Anterior Lower Leg N/A Wounding Event: Bite Bite N/A Primary Etiology: Atypical Atypical N/A Comorbid History: Chronic Obstructive Pulmonary Chronic Obstructive Pulmonary N/A Disease (COPD) Disease (COPD) Date Acquired: 05/08/2021 05/08/2021 N/A Weeks of Treatment: 0 0 N/A Wound Status: Open Open N/A Measurements L x W x D (cm) 0.5x0.4x0.2 0.6x0.6x0.1 N/A Area (cm) : 0.157 0.283 N/A Volume (cm) : 0.031 0.028 N/A Classification: Full Thickness Without Exposed Full Thickness Without Exposed N/A Support Structures Support Structures Exudate Amount:  None Present None Present N/A Granulation Amount: None Present (0%) None Present (0%) N/A Necrotic Amount: Large (67-100%) Large (67-100%) N/A Necrotic Tissue: Eschar Eschar N/A Exposed Structures: Fascia: No Fascia: No N/A Fat Layer (Subcutaneous Tissue): Fat Layer (Subcutaneous Tissue): No No Tendon: No Tendon: No Muscle: No Muscle: No Joint: No Joint: No Bone: No Bone: No Epithelialization: None None N/A Treatment Notes Electronic Signature(s) Signed: 05/30/2021 4:32:42 PM By: [name] Entered By: Kaitlyn Mouse, Minus Breeding on 05/30/2021 10:48:02 [name], [name] ([id number]) -------------------------------------------------------------------------------- Multi-Disciplinary Care Plan Details Patient Name: [name]. Date of Service: 05/30/2021 10:00 AM Medical Record Number: [id number] Patient Account Number: [id number] Date of Birth/Sex: August 25, 1946 (75 y.o. Female) Treating [name]: Dolan Amen Primary Care Lucyann Romano: [name] Other Clinician: Referring Aarica Wax: [name] Treating Kagan Hietpas/Extender: [name] Weeks in Treatment: 0 Active Inactive  Orientation to the Wound Care Program Nursing Diagnoses: Knowledge deficit related to the wound healing center program Goals: Patient/caregiver will verbalize understanding of the [place] Date Initiated: 05/30/2021 Target Resolution Date: 05/30/2021 Goal Status: Active Interventions: Provide education on orientation to the wound center Notes: Wound/Skin Impairment Nursing Diagnoses: Impaired tissue integrity Goals: Patient/caregiver will verbalize understanding of skin care regimen Date Initiated: 05/30/2021 Target Resolution Date: 05/30/2021 Goal Status: Active Ulcer/skin breakdown will have a volume reduction of 30% by week 4 Date Initiated: 05/30/2021 Target Resolution Date: 06/29/2021 Goal Status: Active Ulcer/skin breakdown will have a volume reduction of 50% by week  8 Date Initiated: 05/30/2021 Target Resolution Date: 07/30/2021 Goal Status: Active Ulcer/skin breakdown will have a volume reduction of 80% by week 12 Date Initiated: 05/30/2021 Target Resolution Date: 08/30/2021 Goal Status: Active Ulcer/skin breakdown will heal within 14 weeks Date Initiated: 05/30/2021 Target Resolution Date: 09/29/2021 Goal Status: Active Interventions: Assess patient/caregiver ability to obtain necessary supplies Assess patient/caregiver ability to perform ulcer/skin care regimen upon admission and as needed Assess ulceration(s) every visit Provide education on ulcer and skin care Treatment Activities: Referred to DME Hilery Wintle for dressing supplies : 05/30/2021 Skin care regimen initiated : 05/30/2021 Notes: Electronic Signature(s) Signed: 05/30/2021 4:32:42 PM By: [name] Entered By: Kaitlyn Mouse, Minus Breeding on 05/30/2021 10:47:29 [name], [name] ([id number]) Kise, Addalyne P. ([id number]) -------------------------------------------------------------------------------- Pain Assessment Details Patient Name: [name], [name] Date of Service: 05/30/2021 10:00 AM Medical Record Number: [id number] Patient Account Number: [id number] Date of Birth/Sex: [birth year] (75 y.o. Female) Treating [name]: [name] Primary Care Magdalene Tardiff: [name] Other Clinician: Referring Laqueena Hinchey: [name] Treating Kalika Smay/Extender: [name] Weeks in Treatment: 0 Active Problems Location of Pain Severity and Description of Pain Patient Has Paino No Site Locations Pain Management and Medication Current Pain Management: Electronic Signature(s) Signed: 05/31/2021 3:34:38 PM By: Kaitlyn Coria [name] Entered By: [name] on 05/30/2021 10:11:08 Holloway, [name] ([id number]) -------------------------------------------------------------------------------- Patient/Caregiver Education Details Patient Name: Stammer, Kalisha P. Date of Service: 05/30/2021 10:00 AM Medical Record  Number: [id number] Patient Account Number: [id number] Date of Birth/Gender: [birth year] (75 y.o. Female) Treating [name]: Dolan Amen Primary Care Physician: [name] Other Clinician: Referring Physician: Otilio [name] Treating Physician/Extender: [name] in Treatment: 0 Education Assessment Education Provided To: Patient Education Topics Provided Welcome To The [place]: Methods: Explain/Verbal Responses: State content correctly Wound/Skin Impairment: Methods: Explain/Verbal Responses: State content correctly Electronic Signature(s) Signed: 05/30/2021 4:32:42 PM By: [name] Entered By: Kaitlyn Mouse, Minus Breeding on 05/30/2021 10:57:29 Rotz, Jonnette Mamie Nick ([id number]) -------------------------------------------------------------------------------- Wound Assessment Details Patient Name: Cordon, Kerly P. Date of Service: 05/30/2021 10:00 AM Medical Record Number: [id number] Patient Account Number: [id number] Date of Birth/Sex: Oct 28, 1946 (75 y.o. Female) Treating [name]: [name] Primary Care Wanza Szumski: [name] Other Clinician: Referring Kairyn Olmeda: [name] Treating Ples Trudel/Extender: [name] Weeks in Treatment: 0 Wound Status Wound Number: 1 Primary Etiology: Atypical Wound Location: Left, Medial Lower Leg Wound Status: Open Wounding Event: Bite Comorbid History: Chronic Obstructive Pulmonary Disease (COPD) Date Acquired: 05/08/2021 Weeks Of Treatment: 0 Clustered Wound: No Photos Wound Measurements Length: (cm) 0.5 Width: (cm) 0.4 Depth: (cm) 0.2 Area: (cm) 0.157 Volume: (cm) 0.031 % Reduction in Area: 0% % Reduction in Volume: 0% Epithelialization: None Tunneling: No Undermining: No Wound Description Classification: Full Thickness Without Exposed Support Structu Exudate Amount: Medium Exudate Type: Sanguinous Exudate Color: red res Foul Odor After Cleansing: No Slough/Fibrino Yes Wound Bed Granulation Amount: None  Present (0%) Exposed Structure Necrotic Amount:  Large (67-100%) Fascia Exposed: No Necrotic Quality: Eschar Fat Layer (Subcutaneous Tissue) Exposed: No Tendon Exposed: No Muscle Exposed: No Joint Exposed: No Bone Exposed: No Treatment Notes Wound #1 (Lower Leg) Wound Laterality: Left, Medial Cleanser Byram Ancillary Kit - 15 Day Supply Discharge Instruction: Use supplies as instructed; Kit contains: (15) Saline Bullets; (15) 3x3 Gauze; 15 pr Gloves Soap and Water Discharge Instruction: Gently cleanse wound with antibacterial soap, rinse and pat dry prior to dressing wounds Boening, Breunna P. ([id number]) Peri-Wound Care Topical Triamcinolone Acetonide Cream, 0.1%, 15 (g) tube Discharge Instruction: Moisten prisma and apply periwound Primary Dressing Prisma 4.34 (in) Discharge Instruction: Moisten w/normal saline or sterile water; Cover wound as directed. Do not remove from wound bed. Secondary Dressing Mepilex Border Flex, 4x4 (in/in) Discharge Instruction: Apply to wound as directed. Do not cut. Secured With Tubigrip Size D, 3x10 (in/yd) Discharge Instruction: Apply over dressings Compression Wrap Compression Stockings Add-Ons Electronic Signature(s) Signed: 05/30/2021 4:32:42 PM By: [name] Signed: 05/31/2021 3:34:38 PM By: Kaitlyn Coria [name] Entered By: Kaitlyn Mouse, Minus Breeding on 05/30/2021 10:55:28 Giovanni, Namiah P. ([id number]) -------------------------------------------------------------------------------- Wound Assessment Details Patient Name: Piechota, Aysia P. Date of Service: 05/30/2021 10:00 AM Medical Record Number: [id number] Patient Account Number: [id number] Date of Birth/Sex: 12-08-1946 (75 y.o. Female) Treating [name]: [name] Primary Care Damek Ende: [name] Other Clinician: Referring Maye Parkinson: [name] Treating Twisha Vanpelt/Extender: [name] Weeks in Treatment: 0 Wound Status Wound Number: 2 Primary Etiology: Atypical Wound Location:  Left, Anterior Lower Leg Wound Status: Open Wounding Event: Bite Comorbid History: Chronic Obstructive Pulmonary Disease (COPD) Date Acquired: 05/08/2021 Weeks Of Treatment: 0 Clustered Wound: No Photos Wound Measurements Length: (cm) 0.6 Width: (cm) 0.6 Depth: (cm) 0.1 Area: (cm) 0.283 Volume: (cm) 0.028 % Reduction in Area: 0% % Reduction in Volume: 0% Epithelialization: None Tunneling: No Undermining: No Wound Description Classification: Full Thickness Without Exposed Support Structu Exudate Amount: Medium Exudate Type: Sanguinous Exudate Color: red res Foul Odor After Cleansing: No Slough/Fibrino Yes Wound Bed Granulation Amount: Large (67-100%) Exposed Structure Granulation Quality: Red Fascia Exposed: No Necrotic Amount: None Present (0%) Fat Layer (Subcutaneous Tissue) Exposed: No Tendon Exposed: No Muscle Exposed: No Joint Exposed: No Bone Exposed: No Treatment Notes Wound #2 (Lower Leg) Wound Laterality: Left, Anterior Cleanser Byram Ancillary Kit - 15 Day Supply Discharge Instruction: Use supplies as instructed; Kit contains: (15) Saline Bullets; (15) 3x3 Gauze; 15 pr Gloves Soap and Water Discharge Instruction: Gently cleanse wound with antibacterial soap, rinse and pat dry prior to dressing wounds Jolin, Michaelina P. ([id number]) Peri-Wound Care Topical Triamcinolone Acetonide Cream, 0.1%, 15 (g) tube Discharge Instruction: Moisten prisma and apply periwound Primary Dressing Prisma 4.34 (in) Discharge Instruction: Moisten w/normal saline or sterile water; Cover wound as directed. Do not remove from wound bed. Secondary Dressing Mepilex Border Flex, 4x4 (in/in) Discharge Instruction: Apply to wound as directed. Do not cut. Secured With Tubigrip Size D, 3x10 (in/yd) Discharge Instruction: Apply over dressings Compression Wrap Compression Stockings Add-Ons Electronic Signature(s) Signed: 05/30/2021 4:32:42 PM By: [name] Signed:  05/31/2021 3:34:38 PM By: Kaitlyn Coria [name] Entered By: Kaitlyn Mouse, Minus Breeding on 05/30/2021 10:55:51 Clowers, Rhina P. ([id number]) -------------------------------------------------------------------------------- Vitals Details Patient Name: Gene, Delinda P. Date of Service: 05/30/2021 10:00 AM Medical Record Number: [id number] Patient Account Number: [id number] Date of Birth/Sex: March 02, 1946 (75 y.o. Female) Treating [name]: [name] Primary Care Luan Urbani: [name] Other Clinician: Referring Somnang Mahan: [name] Treating Dryden Tapley/Extender: [name] Weeks in Treatment: 0 Vital Signs Time Taken: 10:11 Temperature (F):  98.5 Height (in): 64 Pulse (bpm): 72 Source: Stated Respiratory Rate (breaths/min): 18 Weight (lbs): 135 Blood Pressure (mmHg): 125/74 Source: Stated Reference Range: 80 - 120 mg / dl Body Mass Index (BMI): 23.2 Electronic Signature(s) Signed: 05/31/2021 3:34:38 PM By: Kaitlyn Coria [name] Entered By: [name] on 05/30/2021 10:12:31

## 2021-05-31 NOTE — Progress Notes (Signed)
Kaitlyn, BALLENGEE ([id number]) Visit Report for 05/30/2021 Abuse/Suicide Risk Screen Details Patient Name: [name], [name]. Date of Service: 05/30/2021 10:00 AM Medical Record Number: [id number] Patient Account Number: [id number] Date of Birth/Sex: 1946-11-26 (75 y.o. Female) Treating RN: Yevonne Pax Primary Care Mairlyn Tegtmeyer: Elizabeth Sauer Other Clinician: Referring Mayank Teuscher: Elizabeth Sauer Treating Indigo Barbian/Extender: Allen Derry Weeks in Treatment: 0 Abuse/Suicide Risk Screen Items Answer ABUSE RISK SCREEN: Has anyone close to you tried to hurt or harm you recentlyo No Do you feel uncomfortable with anyone in your familyo No Has anyone forced you do things that you didnot want to doo No Electronic Signature(s) Signed: 05/31/2021 3:34:38 PM By: Yevonne Pax RN Entered By: Yevonne Pax on 05/30/2021 10:16:25 Stopka, Victorian P. ([id number]) -------------------------------------------------------------------------------- Activities of Daily Living Details Patient Name: [name], [name] Date of Service: 05/30/2021 10:00 AM Medical Record Number: [id number] Patient Account Number: [id number] Date of Birth/Sex: [birth year] (75 y.o. Female) Treating RN: Yevonne Pax Primary Care Shironda Kain: Elizabeth Sauer Other Clinician: Referring Orean Giarratano: Elizabeth Sauer Treating James Lafalce/Extender: Rowan Blase in Treatment: 0 Activities of Daily Living Items Answer Activities of Daily Living (Please select one for each item) Drive Automobile Completely Able Take Medications Completely Able Use Telephone Completely Able Care for Appearance Completely Able Use Toilet Completely Able Bath / Shower Completely Able Dress Self Completely Able Feed Self Completely Able Walk Completely Able Get In / Out Bed Completely Able Housework Completely Able Prepare Meals Completely Able Handle Money Completely Able Shop for Self Completely Able Electronic Signature(s) Signed: 05/31/2021 3:34:38 PM By: Yevonne Pax  RN Entered By: Yevonne Pax on 05/30/2021 10:16:50 Evon, Eathel Demetrius Charity ([id number]) -------------------------------------------------------------------------------- Education Screening Details Patient Name: [name], [name] Date of Service: 05/30/2021 10:00 AM Medical Record Number: [id number] Patient Account Number: [id number] Date of Birth/Sex: 08-04-1946 (75 y.o. Female) Treating RN: Yevonne Pax Primary Care Brixon Zhen: Elizabeth Sauer Other Clinician: Referring Jobina Maita: Elizabeth Sauer Treating Jhoselin Crume/Extender: Rowan Blase in Treatment: 0 Primary Learner Assessed: Patient Learning Preferences/Education Level/Primary Language Learning Preference: Explanation Highest Education Level: College or Above Preferred Language: English Cognitive Barrier Language Barrier: No Translator Needed: No Memory Deficit: No Emotional Barrier: No Cultural/Religious Beliefs Affecting Medical Care: No Physical Barrier Impaired Vision: Yes Glasses Impaired Hearing: No Decreased Hand dexterity: No Knowledge/Comprehension Knowledge Level: Medium Comprehension Level: High Ability to understand written instructions: High Ability to understand verbal instructions: High Motivation Anxiety Level: Anxious Cooperation: Cooperative Education Importance: Acknowledges Need Interest in Health Problems: Asks Questions Perception: Coherent Willingness to Engage in Self-Management High Activities: Readiness to Engage in Self-Management High Activities: Electronic Signature(s) Signed: 05/31/2021 3:34:38 PM By: Yevonne Pax RN Entered By: Yevonne Pax on 05/30/2021 10:18:16 Tippett, Lile P. ([id number]) -------------------------------------------------------------------------------- Fall Risk Assessment Details Patient Name: [name], [name] Date of Service: 05/30/2021 10:00 AM Medical Record Number: [id number] Patient Account Number: [id number] Date of Birth/Sex: [birth year] (75 y.o. Female) Treating RN: Yevonne Pax Primary Care Salimata Christenson: Elizabeth Sauer Other Clinician: Referring Keyla Milone: Elizabeth Sauer Treating Ava Deguire/Extender: Rowan Blase in Treatment: 0 Fall Risk Assessment Items Have you had 2 or more falls in the last 12 monthso 0 No Have you had any fall that resulted in injury in the last 12 monthso 0 No FALLS RISK SCREEN History of falling - immediate or within 3 months 0 No Secondary diagnosis (Do you have 2 or more medical diagnoseso) 0 No Ambulatory aid None/bed rest/wheelchair/nurse 0 No Crutches/cane/walker 0 No Furniture 0 No Intravenous therapy Access/Saline/Heparin Lock 0 No Gait/Transferring Normal/ bed rest/ wheelchair 0 No Weak (short steps  with or without shuffle, stooped but able to lift head while walking, may 0 No seek support from furniture) Impaired (short steps with shuffle, may have difficulty arising from chair, head down, impaired 0 No balance) Mental Status Oriented to own ability 0 No Electronic Signature(s) Signed: 05/31/2021 3:34:38 PM By: Yevonne Pax RN Entered By: Yevonne Pax on 05/30/2021 10:18:23 Zanders, Cleone P. ([id number]) -------------------------------------------------------------------------------- Foot Assessment Details Patient Name: [name], [name] Date of Service: 05/30/2021 10:00 AM Medical Record Number: [id number] Patient Account Number: [id number] Date of Birth/Sex: 1946-01-30 (75 y.o. Female) Treating RN: Yevonne Pax Primary Care Saskia Simerson: Elizabeth Sauer Other Clinician: Referring Precilla Purnell: Elizabeth Sauer Treating Eldor Conaway/Extender: Allen Derry Weeks in Treatment: 0 Foot Assessment Items Site Locations + = Sensation present, - = Sensation absent, C = Callus, U = Ulcer R = Redness, W = Warmth, M = Maceration, PU = Pre-ulcerative lesion F = Fissure, S = Swelling, D = Dryness Assessment Right: Left: Other Deformity: No No Prior Foot Ulcer: No No Prior Amputation: No No Charcot Joint: No No Ambulatory Status: Ambulatory  Without Help Gait: Steady Electronic Signature(s) Signed: 05/31/2021 3:34:38 PM By: Yevonne Pax RN Entered By: Yevonne Pax on 05/30/2021 10:27:52 Tauer, Capria P. ([id number]) -------------------------------------------------------------------------------- Nutrition Risk Screening Details Patient Name: Rubinstein, Demetri P. Date of Service: 05/30/2021 10:00 AM Medical Record Number: [id number] Patient Account Number: [id number] Date of Birth/Sex: May 22, 1946 (75 y.o. Female) Treating RN: Yevonne Pax Primary Care Monifah Freehling: Elizabeth Sauer Other Clinician: Referring Bilbo Carcamo: Elizabeth Sauer Treating Lyam Provencio/Extender: Allen Derry Weeks in Treatment: 0 Height (in): 64 Weight (lbs): 135 Body Mass Index (BMI): 23.2 Nutrition Risk Screening Items Score Screening NUTRITION RISK SCREEN: I have an illness or condition that made me change the kind and/or amount of food I eat 0 No I eat fewer than two meals per day 0 No I eat few fruits and vegetables, or milk products 0 No I have three or more drinks of beer, liquor or wine almost every day 0 No I have tooth or mouth problems that make it hard for me to eat 0 No I don't always have enough money to buy the food I need 0 No I eat alone most of the time 0 No I take three or more different prescribed or over-the-counter drugs a day 0 No Without wanting to, I have lost or gained 10 pounds in the last six months 0 No I am not always physically able to shop, cook and/or feed myself 0 No Nutrition Protocols Good Risk Protocol Moderate Risk Protocol High Risk Proctocol Risk Level: Good Risk Score: 0 Electronic Signature(s) Signed: 05/31/2021 3:34:38 PM By: Yevonne Pax RN Entered By: Yevonne Pax on 05/30/2021 10:18:38

## 2021-06-01 ENCOUNTER — Other Ambulatory Visit: Payer: Self-pay

## 2021-06-01 ENCOUNTER — Telehealth: Payer: Self-pay | Admitting: Family Medicine

## 2021-06-01 DIAGNOSIS — J432 Centrilobular emphysema: Secondary | ICD-10-CM

## 2021-06-01 MED ORDER — FLUTICASONE-SALMETEROL 250-50 MCG/ACT IN AEPB: 1.0000 | INHALATION_SPRAY | Freq: Two times a day (BID) | RESPIRATORY_TRACT | 1 refills | Status: DC

## 2021-06-01 NOTE — Progress Notes (Signed)
LINDE, WILENSKY ([id number]) Visit Report for 05/30/2021 Chief Complaint Document Details Patient Name: [name], [name]. Date of Service: 05/30/2021 10:00 AM Medical Record Number: [id number] Patient Account Number: [id number] Date of Birth/Sex: 05-19-1946 (75 y.o. Female) Treating RN: [name] Primary Care Provider: Otilio [name] Other Clinician: Referring Provider: Otilio [name] Treating Provider/Extender: [name] in Treatment: 0 Information Obtained from: Patient Chief Complaint Left LE Ulcers following tick bites Electronic Signature(s) Signed: 05/30/2021 10:42:31 AM By: Kaitlyn Keeler PA-C Entered By: [name] on 05/30/2021 10:42:31 [name], [name] ([id number]) -------------------------------------------------------------------------------- Debridement Details Patient Name: [name], [name] Date of Service: 05/30/2021 10:00 AM Medical Record Number: [id number] Patient Account Number: [id number] Date of Birth/Sex: 1946/07/22 (75 y.o. Female) Treating RN: [name] Primary Care Provider: Otilio [name] Other Clinician: Referring Provider: Otilio [name] Treating Provider/Extender: Jeri Cos Weeks in Treatment: 0 Debridement Performed for Wound #1 Left,Medial Lower Leg Assessment: Performed By: Physician Kaitlyn Sams., PA-C Debridement Type: Debridement Level of Consciousness (Pre- Awake and Alert procedure): Pre-procedure Verification/Time Out Yes - 10:48 Taken: Start Time: 10:48 Total Area Debrided (L x W): 0.5 (cm) x 0.4 (cm) = 0.2 (cm) Tissue and other material Viable, Non-Viable, Eschar, Slough, Subcutaneous, Slough debrided: Level: Skin/Subcutaneous Tissue Debridement Description: Excisional Instrument: Curette Bleeding: Minimum Hemostasis Achieved: Pressure Response to Treatment: Procedure was tolerated well Level of Consciousness (Post- Awake and Alert procedure): Post Debridement Measurements of Total Wound Length: (cm) 0.5 Width:  (cm) 0.4 Depth: (cm) 0.3 Volume: (cm) 0.047 Character of Wound/Ulcer Post Debridement: Stable Post Procedure Diagnosis Same as Pre-procedure Electronic Signature(s) Signed: 05/30/2021 4:32:42 PM By: Kaitlyn Mouse, Minus Breeding RN Signed: 06/01/2021 4:01:31 PM By: Kaitlyn Keeler PA-C Entered By: [name] on 05/30/2021 10:49:05 [name], [name] ([id number]) -------------------------------------------------------------------------------- Debridement Details Patient Name: [name], [name] Date of Service: 05/30/2021 10:00 AM Medical Record Number: [id number] Patient Account Number: [id number] Date of Birth/Sex: [birth year] (75 y.o. Female) Treating RN: [name] Primary Care Provider: Otilio [name] Other Clinician: Referring Provider: Otilio [name] Treating Provider/Extender: Jeri Cos Weeks in Treatment: 0 Debridement Performed for Wound #2 Left,Anterior Lower Leg Assessment: Performed By: Physician Kaitlyn Sams., PA-C Debridement Type: Debridement Level of Consciousness (Pre- Awake and Alert procedure): Pre-procedure Verification/Time Out Yes - 10:48 Taken: Start Time: 10:48 Total Area Debrided (L x W): 0.6 (cm) x 0.6 (cm) = 0.36 (cm) Tissue and other material Viable, Non-Viable, Eschar, Slough, Subcutaneous, Slough debrided: Level: Skin/Subcutaneous Tissue Debridement Description: Excisional Instrument: Curette Bleeding: Minimum Hemostasis Achieved: Pressure Response to Treatment: Procedure was tolerated well Level of Consciousness (Post- Awake and Alert procedure): Post Debridement Measurements of Total Wound Length: (cm) 0.6 Width: (cm) 0.6 Depth: (cm) 0.2 Volume: (cm) 0.057 Character of Wound/Ulcer Post Debridement: Stable Post Procedure Diagnosis Same as Pre-procedure Electronic Signature(s) Signed: 05/30/2021 4:32:42 PM By: Kaitlyn Mouse, Minus Breeding RN Signed: 06/01/2021 4:01:31 PM By: Kaitlyn Keeler PA-C Entered By: [name] on  05/30/2021 10:49:26 [name], [name] ([id number]) -------------------------------------------------------------------------------- HPI Details Patient Name: [name], [name] Date of Service: 05/30/2021 10:00 AM Medical Record Number: [id number] Patient Account Number: [id number] Date of Birth/Sex: 1946/07/31 (75 y.o. Female) Treating RN: [name] Primary Care Provider: Otilio [name] Other Clinician: Referring Provider: Otilio [name] Treating Provider/Extender: Jeri Cos Weeks in Treatment: 0 History of Present Illness HPI Description: 05/30/2021 upon evaluation today patient presents for initial inspection here in our clinic concerning issues that she has been having with 2 wounds currently which are on her left lower leg. She notes that one of  them was a definitive tick that she removed and then had to reaction from that the second is a spot that looks identical but she did not actually identify a specific tick she had a scab that she pulled off but never found a tick within the scab that she could tell anyway. Fortunately there does not appear to be any signs of active infection systemically at this time which is great news. No fevers, chills, nausea, vomiting, or diarrhea. With that being said unfortunately she is having a lot of irritation including itching around the edges of the wound and even pain as well in regard to the anterior wound. 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 She is not extremely swollen however at this point. She also does have emphysema but otherwise no major medical problems that would affect her wound healing. She is concerned about the tick bites as her husband apparently has had some issues with tick fevers in the past. She does not want this to turn into anything more significant. The patient currently has been using salt water to clean the area with along with Polysporin and Neosporin and peroxide as well. She  tells me that this initially occurred on May 08, 2021. She has not really been keeping it covered it is somewhat scabbed to some degree today. Electronic Signature(s) Signed: 06/01/2021 3:58:34 PM By: Kaitlyn Keeler PA-C Previous Signature: 05/30/2021 4:09:52 PM Version By: Kaitlyn Keeler PA-C Entered By: [name] on 06/01/2021 15:58:33 [name], Kaitlyn Mamie Nick ([id number]) -------------------------------------------------------------------------------- Physical Exam Details Patient Name: [name], [name] Date of Service: 05/30/2021 10:00 AM Medical Record Number: [id number] Patient Account Number: [id number] Date of Birth/Sex: [birth year] (75 y.o. Female) Treating RN: [name] Primary Care Provider: Otilio [name] Other Clinician: Referring Provider: Otilio [name] Treating Provider/Extender: Jeri Cos Weeks in Treatment: 0 Constitutional sitting or standing blood pressure is within target range for patient.. pulse regular and within target range for patient.Marland Kitchen respirations regular, non- labored and within target range for patient.Marland Kitchen temperature within target range for patient.. Well-nourished and well-hydrated in no acute distress. Eyes conjunctiva clear no eyelid edema noted. pupils equal round and reactive to light and accommodation. Ears, Nose, Mouth, and Throat no gross abnormality of ear auricles or external auditory canals. normal hearing noted during conversation. mucus membranes moist. Respiratory normal breathing without difficulty. Cardiovascular 2+ dorsalis pedis/posterior tibialis pulses. trace pitting edema of the bilateral lower extremities. Musculoskeletal normal gait and posture. no significant deformity or arthritic changes, no loss or range of motion, no clubbing. Psychiatric this patient is able to make decisions and demonstrates good insight into disease process. Alert and Oriented x 3. pleasant and cooperative. Notes Upon inspection patient's wounds again did show  some irritation and inflammation. There does not appear to be any evidence of active infection which is great news systemically though locally I am concerned that she may have a little bit of irritation but I still do not really think that she necessarily has an infection at this point. Her blood pressure is doing quite well today as well and vital signs are all pretty much within normal limits. Patient's wound bed actually showed signs of good granulation after removing some of the eschar did have to perform debridement to get some of the eschar cleared away today. She tolerated that with discomfort mainly in regard to the anterior wound the medial really was not painful. Electronic Signature(s) Signed: 06/01/2021 3:59:18 PM By: Kaitlyn Keeler  PA-C Entered By: [name] on 06/01/2021 15:59:17 [name], Kaitlyn Mamie Nick ([id number]) -------------------------------------------------------------------------------- Physician Orders Details Patient Name: [name], [name] Date of Service: 05/30/2021 10:00 AM Medical Record Number: [id number] Patient Account Number: [id number] Date of Birth/Sex: 1946-07-07 (75 y.o. Female) Treating RN: [name] Primary Care Provider: Otilio [name] Other Clinician: Referring Provider: Otilio [name] Treating Provider/Extender: [name] in Treatment: 0 Verbal / Phone Orders: No Diagnosis Coding ICD-10 Coding Code Description [phone] Varicose veins of bilateral lower extremities with other complications R41.6 Venous insufficiency (chronic) (peripheral) L97.822 Non-pressure chronic ulcer of other part of left lower leg with fat layer exposed J43.9 Emphysema, unspecified Follow-up Appointments o Return Appointment in 1 week. Bathing/ Shower/ Hygiene o May shower; gently cleanse wound with antibacterial soap, rinse and pat dry prior to dressing wounds Wound Treatment Wound #1 - Lower Leg Wound Laterality: Left, Medial Cleanser: Byram Ancillary Kit - 15  Day Supply (DME) (Generic) 3 x Per Week/30 Days Discharge Instructions: Use supplies as instructed; Kit contains: (15) Saline Bullets; (15) 3x3 Gauze; 15 pr Gloves Cleanser: Soap and Water 3 x Per Week/30 Days Discharge Instructions: Gently cleanse wound with antibacterial soap, rinse and pat dry prior to dressing wounds Topical: Triamcinolone Acetonide Cream, 0.1%, 15 (g) tube 3 x Per Week/30 Days Discharge Instructions: Moisten prisma and apply periwound Primary Dressing: Prisma 4.34 (in) (DME) (Generic) 3 x Per Week/30 Days Discharge Instructions: Moisten w/normal saline or sterile water; Cover wound as directed. Do not remove from wound bed. Secondary Dressing: Mepilex Border Flex, 4x4 (in/in) (DME) (Generic) 3 x Per Week/30 Days Discharge Instructions: Apply to wound as directed. Do not cut. Secured With: Tubigrip Size D, 3x10 (in/yd) 3 x Per Week/30 Days Discharge Instructions: Apply over dressings Wound #2 - Lower Leg Wound Laterality: Left, Anterior Cleanser: Byram Ancillary Kit - 15 Day Supply (Generic) 3 x Per Week/30 Days Discharge Instructions: Use supplies as instructed; Kit contains: (15) Saline Bullets; (15) 3x3 Gauze; 15 pr Gloves Cleanser: Soap and Water 3 x Per Week/30 Days Discharge Instructions: Gently cleanse wound with antibacterial soap, rinse and pat dry prior to dressing wounds Topical: Triamcinolone Acetonide Cream, 0.1%, 15 (g) tube 3 x Per Week/30 Days Discharge Instructions: Moisten prisma and apply periwound Primary Dressing: Prisma 4.34 (in) (Generic) 3 x Per Week/30 Days Discharge Instructions: Moisten w/normal saline or sterile water; Cover wound as directed. Do not remove from wound bed. Secondary Dressing: Mepilex Border Flex, 4x4 (in/in) (DME) (Generic) 3 x Per Week/30 Days Discharge Instructions: Apply to wound as directed. Do not cut. Secured With: Tubigrip Size D, 3x10 (in/yd) 3 x Per Week/30 Days Discharge Instructions: Apply over dressings GALE, KLAR ([id number]) Patient Medications Allergies: Sulfa (Sulfonamide Antibiotics), shrimp Notifications Medication Indication Start End triamcinolone acetonide 06/01/2021 DOSE topical 0.1 % ointment - ointment topical applied with each dressing change to the wound bed as directed in the clinic 3 times per week x 30 days Electronic Signature(s) Signed: 06/01/2021 8:46:46 AM By: Kaitlyn Keeler PA-C Previous Signature: 05/30/2021 4:32:42 PM Version By: Kaitlyn Mouse, Minus Breeding RN Entered By: [name] on 06/01/2021 08:46:45 [name], [name] ([id number]) -------------------------------------------------------------------------------- Problem List Details Patient Name: [name], [name] Date of Service: 05/30/2021 10:00 AM Medical Record Number: [id number] Patient Account Number: [id number] Date of Birth/Sex: 29-May-1946 (75 y.o. Female) Treating RN: [name] Primary Care Provider: Otilio [name] Other Clinician: Referring Provider: Otilio [name] Treating Provider/Extender: [name] in Treatment: 0 Active Problems ICD-10 Encounter Code Description Active Date MDM Diagnosis [phone]  Varicose veins of bilateral lower extremities with other complications 1/61/0960 No Yes I87.2 Venous insufficiency (chronic) (peripheral) 05/30/2021 No Yes L97.822 Non-pressure chronic ulcer of other part of left lower leg with fat layer 05/30/2021 No Yes exposed J43.9 Emphysema, unspecified 05/30/2021 No Yes Inactive Problems Resolved Problems Electronic Signature(s) Signed: 05/30/2021 10:42:06 AM By: Kaitlyn Keeler PA-C Previous Signature: 05/30/2021 10:34:29 AM Version By: Kaitlyn Keeler PA-C Entered By: [name] on 05/30/2021 10:42:06 [name], [name] ([id number]) -------------------------------------------------------------------------------- Progress Note Details Patient Name: [name], [name] Date of Service: 05/30/2021 10:00 AM Medical Record Number: [id number] Patient Account  Number: [id number] Date of Birth/Sex: 10-11-1946 (75 y.o. Female) Treating RN: [name] Primary Care Provider: Otilio [name] Other Clinician: Referring Provider: Otilio [name] Treating Provider/Extender: Jeri Cos Weeks in Treatment: 0 Subjective Chief Complaint Information obtained from Patient Left LE Ulcers following tick bites History of Present Illness (HPI) 05/30/2021 upon evaluation today patient presents for initial inspection here in our clinic concerning issues that she has been having with 2 wounds currently which are on her left lower leg. She notes that one of them was a definitive tick that she removed and then had to reaction from that the second is a spot that looks identical but she did not actually identify a specific tick she had a scab that she pulled off but never found a tick within the scab that she could tell anyway. Fortunately there does not appear to be any signs of active infection systemically at this time which is great news. No fevers, chills, nausea, vomiting, or diarrhea. With that being said unfortunately she is having a lot of irritation including itching around the edges of the wound and even pain as well in regard to the anterior wound. 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 She is not extremely swollen however at this point. She also does have emphysema but otherwise no major medical problems that would affect her wound healing. She is concerned about the tick bites as her husband apparently has had some issues with tick fevers in the past. She does not want this to turn into anything more significant. The patient currently has been using salt water to clean the area with along with Polysporin and Neosporin and peroxide as well. She tells me that this initially occurred on May 08, 2021. She has not really been keeping it covered it is somewhat scabbed to some degree today. Patient  History Information obtained from Patient. Allergies Sulfa (Sulfonamide Antibiotics), shrimp Social History Current every day smoker, Marital Status - Married, Alcohol Use - Moderate, Drug Use - No History, Caffeine Use - Daily. Medical History Respiratory Patient has history of Chronic Obstructive Pulmonary Disease (COPD) Review of Systems (ROS) Integumentary (Skin) Complains or has symptoms of Wounds. Objective Constitutional sitting or standing blood pressure is within target range for patient.. pulse regular and within target range for patient.Marland Kitchen respirations regular, non- labored and within target range for patient.Marland Kitchen temperature within target range for patient.. Well-nourished and well-hydrated in no acute distress. Vitals Time Taken: 10:11 AM, Height: 64 in, Source: Stated, Weight: 135 lbs, Source: Stated, BMI: 23.2, Temperature: 98.5 F, Pulse: 72 bpm, Respiratory Rate: 18 breaths/min, Blood Pressure: 125/74 mmHg. Eyes conjunctiva clear no eyelid edema noted. pupils equal round and reactive to light and accommodation. Ears, Nose, Mouth, and Throat no gross abnormality of ear auricles or external auditory canals. normal hearing noted during conversation. mucus membranes moist. Respiratory normal breathing without  difficulty. AUNICA, DAUPHINEE ([id number]) Cardiovascular 2+ dorsalis pedis/posterior tibialis pulses. trace pitting edema of the bilateral lower extremities. Musculoskeletal normal gait and posture. no significant deformity or arthritic changes, no loss or range of motion, no clubbing. Psychiatric this patient is able to make decisions and demonstrates good insight into disease process. Alert and Oriented x 3. pleasant and cooperative. General Notes: Upon inspection patient's wounds again did show some irritation and inflammation. There does not appear to be any evidence of active infection which is great news systemically though locally I am concerned that she may have a  little bit of irritation but I still do not really think that she necessarily has an infection at this point. Her blood pressure is doing quite well today as well and vital signs are all pretty much within normal limits. Patient's wound bed actually showed signs of good granulation after removing some of the eschar did have to perform debridement to get some of the eschar cleared away today. She tolerated that with discomfort mainly in regard to the anterior wound the medial really was not painful. Integumentary (Hair, Skin) Wound #1 status is Open. Original cause of wound was Bite. The date acquired was: 05/08/2021. The wound is located on the Left,Medial Lower Leg. The wound measures 0.5cm length x 0.4cm width x 0.2cm depth; 0.157cm^2 area and 0.031cm^3 volume. There is no tunneling or undermining noted. There is a medium amount of sanguinous drainage noted. There is no granulation within the wound bed. There is a large (67- 100%) amount of necrotic tissue within the wound bed including Eschar. Wound #2 status is Open. Original cause of wound was Bite. The date acquired was: 05/08/2021. The wound is located on the Left,Anterior Lower Leg. The wound measures 0.6cm length x 0.6cm width x 0.1cm depth; 0.283cm^2 area and 0.028cm^3 volume. There is no tunneling or undermining noted. There is a medium amount of sanguinous drainage noted. There is large (67-100%) red granulation within the wound bed. There is no necrotic tissue within the wound bed. Assessment Active Problems ICD-10 Varicose veins of bilateral lower extremities with other complications Venous insufficiency (chronic) (peripheral) Non-pressure chronic ulcer of other part of left lower leg with fat layer exposed Emphysema, unspecified Procedures Wound #1 Pre-procedure diagnosis of Wound #1 is an Atypical located on the Left,Medial Lower Leg . There was a Excisional Skin/Subcutaneous Tissue Debridement with a total area of 0.2 sq cm  performed by Kaitlyn Sams., PA-C. With the following instrument(s): Curette to remove Viable and Non-Viable tissue/material. Material removed includes Eschar, Subcutaneous Tissue, and Slough. A time out was conducted at 10:48, prior to the start of the procedure. A Minimum amount of bleeding was controlled with Pressure. The procedure was tolerated well. Post Debridement Measurements: 0.5cm length x 0.4cm width x 0.3cm depth; 0.047cm^3 volume. Character of Wound/Ulcer Post Debridement is stable. Post procedure Diagnosis Wound #1: Same as Pre-Procedure Wound #2 Pre-procedure diagnosis of Wound #2 is an Atypical located on the Left,Anterior Lower Leg . There was a Excisional Skin/Subcutaneous Tissue Debridement with a total area of 0.36 sq cm performed by Kaitlyn Sams., PA-C. With the following instrument(s): Curette to remove Viable and Non-Viable tissue/material. Material removed includes Eschar, Subcutaneous Tissue, and Slough. A time out was conducted at 10:48, prior to the start of the procedure. A Minimum amount of bleeding was controlled with Pressure. The procedure was tolerated well. Post Debridement Measurements: 0.6cm length x 0.6cm width x 0.2cm depth; 0.057cm^3 volume. Character of Wound/Ulcer Post Debridement is  stable. Post procedure Diagnosis Wound #2: Same as Pre-Procedure Plan CEONNA, FRAZZINI. ([id number]) Follow-up Appointments: Return Appointment in 1 week. Bathing/ Shower/ Hygiene: May shower; gently cleanse wound with antibacterial soap, rinse and pat dry prior to dressing wounds The following medication(s) was prescribed: triamcinolone acetonide topical 0.1 % ointment ointment topical applied with each dressing change to the wound bed as directed in the clinic 3 times per week x 30 days starting 06/01/2021 WOUND #1: - Lower Leg Wound Laterality: Left, Medial Cleanser: Byram Ancillary Kit - 15 Day Supply (DME) (Generic) 3 x Per Week/30 Days Discharge Instructions: Use  supplies as instructed; Kit contains: (15) Saline Bullets; (15) 3x3 Gauze; 15 pr Gloves Cleanser: Soap and Water 3 x Per Week/30 Days Discharge Instructions: Gently cleanse wound with antibacterial soap, rinse and pat dry prior to dressing wounds Topical: Triamcinolone Acetonide Cream, 0.1%, 15 (g) tube 3 x Per Week/30 Days Discharge Instructions: Moisten prisma and apply periwound Primary Dressing: Prisma 4.34 (in) (DME) (Generic) 3 x Per Week/30 Days Discharge Instructions: Moisten w/normal saline or sterile water; Cover wound as directed. Do not remove from wound bed. Secondary Dressing: Mepilex Border Flex, 4x4 (in/in) (DME) (Generic) 3 x Per Week/30 Days Discharge Instructions: Apply to wound as directed. Do not cut. Secured With: Tubigrip Size D, 3x10 (in/yd) 3 x Per Week/30 Days Discharge Instructions: Apply over dressings WOUND #2: - Lower Leg Wound Laterality: Left, Anterior Cleanser: Byram Ancillary Kit - 15 Day Supply (Generic) 3 x Per Week/30 Days Discharge Instructions: Use supplies as instructed; Kit contains: (15) Saline Bullets; (15) 3x3 Gauze; 15 pr Gloves Cleanser: Soap and Water 3 x Per Week/30 Days Discharge Instructions: Gently cleanse wound with antibacterial soap, rinse and pat dry prior to dressing wounds Topical: Triamcinolone Acetonide Cream, 0.1%, 15 (g) tube 3 x Per Week/30 Days Discharge Instructions: Moisten prisma and apply periwound Primary Dressing: Prisma 4.34 (in) (Generic) 3 x Per Week/30 Days Discharge Instructions: Moisten w/normal saline or sterile water; Cover wound as directed. Do not remove from wound bed. Secondary Dressing: Mepilex Border Flex, 4x4 (in/in) (DME) (Generic) 3 x Per Week/30 Days Discharge Instructions: Apply to wound as directed. Do not cut. Secured With: Tubigrip Size D, 3x10 (in/yd) 3 x Per Week/30 Days Discharge Instructions: Apply over dressings 1. Would recommend currently that we have the patient go ahead and continue to clean  this wound on a regular basis. With that being said I would recommend not using the peroxide as this can be cytotoxic along with feeling bacteria. Its good for short-term but not long-term. 2. I am also can recommend at this time that we use silver collagen as the dressing of choice. I am also can recommend that we actually just use some of the triamcinolone to moisten the collagen and then put this on to the wound bed I think this to probably be the easiest way to do this to ensure that hopefully the collagen gets there as well. We are covering this with a border foam dressing. 3. I am also can recommend that we have the patient utilize Tubigrip to help with any minimal fluid retention she may have as far as edema and swelling is concerned. I do not think we have to compression wrap or I would prefer to be able to change the dressings although things do not seem to be progressing like I want to see we may consider a compression wrap as well. We will see patient back for reevaluation in 1 week here in the  clinic. If anything worsens or changes patient will contact our office for additional recommendations. Electronic Signature(s) Signed: 06/01/2021 4:00:47 PM By: Kaitlyn Keeler PA-C Entered By: [name] on 06/01/2021 16:00:47 Selden, Rowynn PMarland Kitchen ([id number]) -------------------------------------------------------------------------------- ROS/PFSH Details Patient Name: Ciaravino, Roneka P. Date of Service: 05/30/2021 10:00 AM Medical Record Number: [id number] Patient Account Number: [id number] Date of Birth/Sex: 15-May-1946 (75 y.o. Female) Treating RN: Carlene Coria Primary Care Provider: Otilio [name] Other Clinician: Referring Provider: Otilio [name] Treating Provider/Extender: [name] in Treatment: 0 Information Obtained From Patient Integumentary (Skin) Complaints and Symptoms: Positive for: Wounds Respiratory Medical History: Positive for: Chronic Obstructive Pulmonary Disease  (COPD) Immunizations Pneumococcal Vaccine: Received Pneumococcal Vaccination: Yes Implantable Devices None Family and Social History Current every day smoker; Marital Status - Married; Alcohol Use: Moderate; Drug Use: No History; Caffeine Use: Daily; Financial Concerns: No; Food, Clothing or Shelter Needs: No; Support System Lacking: No; Transportation Concerns: No Electronic Signature(s) Signed: 05/31/2021 3:34:38 PM By: Carlene Coria RN Signed: 06/01/2021 4:01:31 PM By: Kaitlyn Keeler PA-C Entered By: Carlene Coria on 05/30/2021 10:16:16 Preslar, Gennavieve P. ([id number]) -------------------------------------------------------------------------------- SuperBill Details Patient Name: Vanvorst, Coryn P. Date of Service: 05/30/2021 Medical Record Number: [id number] Patient Account Number: [id number] Date of Birth/Sex: [birth year] (75 y.o. Female) Treating RN: [name] Primary Care Provider: Otilio [name] Other Clinician: Referring Provider: Otilio [name] Treating Provider/Extender: Jeri Cos Weeks in Treatment: 0 Diagnosis Coding ICD-10 Codes Code Description [phone] Varicose veins of bilateral lower extremities with other complications J09.3 Venous insufficiency (chronic) (peripheral) L97.822 Non-pressure chronic ulcer of other part of left lower leg with fat layer exposed J43.9 Emphysema, unspecified Facility Procedures CPT4 Code: 26712458 Description: 09983 - WOUND CARE VISIT-LEV 3 EST PT Modifier: Quantity: 1 CPT4 Code: 38250539 Description: 76734 - DEB SUBQ TISSUE 20 SQ CM/< Modifier: Quantity: 1 CPT4 Code: Description: ICD-10 Diagnosis Description L97.822 Non-pressure chronic ulcer of other part of left lower leg with fat layer Modifier: exposed Quantity: Physician Procedures CPT4 Code: 1937902 Description: 40973 - WC PHYS LEVEL 4 - NEW PT Modifier: 25 Quantity: 1 CPT4 Code: Description: ICD-10 Diagnosis Description I83.893 Varicose veins of bilateral lower extremities  with other complications Z32.9 Venous insufficiency (chronic) (peripheral) L97.822 Non-pressure chronic ulcer of other part of left lower leg with fat layer  J43.9 Emphysema, unspecified Modifier: exposed Quantity: CPT4 Code: 9242683 Description: 41962 - WC PHYS SUBQ TISS 20 SQ CM Modifier: Quantity: 1 CPT4 Code: Description: ICD-10 Diagnosis Description L97.822 Non-pressure chronic ulcer of other part of left lower leg with fat layer Modifier: exposed Quantity: Electronic Signature(s) Signed: 06/01/2021 4:01:04 PM By: Kaitlyn Keeler PA-C Previous Signature: 05/30/2021 4:32:42 PM Version By: Kaitlyn Mouse, Minus Breeding RN Entered By: [name] on 06/01/2021 16:01:03

## 2021-06-01 NOTE — Telephone Encounter (Signed)
Pt called in to request a refill for Fluticasone-Salmeterol (ADVAIR DISKUS) 250-50 MCG/DOSE AEPB . Pt says that she had a ov the other day, pt feels that provider forgot to send Rx    Pharmacy:  [facility] - [place], [place] - [address] Phone:  [phone]  Fax:  [phone]

## 2021-06-01 NOTE — Progress Notes (Unsigned)
Sent in wixela

## 2021-06-06 ENCOUNTER — Encounter: Payer: Medicare Other | Admitting: Physician Assistant

## 2021-06-06 ENCOUNTER — Other Ambulatory Visit: Payer: Self-pay

## 2021-06-06 DIAGNOSIS — L97822 Non-pressure chronic ulcer of other part of left lower leg with fat layer exposed: Secondary | ICD-10-CM | POA: Diagnosis not present

## 2021-06-06 NOTE — Progress Notes (Addendum)
[name], Franchon P. ([id number]) Visit Report for 06/06/2021 Chief Complaint Document Details Patient Name: [name], [name] Date of Service: 06/06/2021 9:15 AM Medical Record Number: [id number] Patient Account Number: [id number] Date of Birth/Sex: 27-Jun-1946 51(75 y.o. F) Treating RN: Rogers BlockerSanchez, Kenia Primary Care Provider: Elizabeth SauerJones, Deanna Other Clinician: Referring Provider: Elizabeth SauerJones, Deanna Treating Provider/Extender: Rowan BlaseStone, Itzayanna Kaster Weeks in Treatment: 1 Information Obtained from: Patient Chief Complaint Left LE Ulcers following tick bites Electronic Signature(s) Signed: 06/06/2021 9:40:14 AM By: Lenda KelpStone III, Kearia Yin PA-C Entered By: Lenda KelpStone III, Kamber Vignola on 06/06/2021 09:40:13 Mumby, Dachelle P. ([id number]) -------------------------------------------------------------------------------- HPI Details Patient Name: [name], [name] Date of Service: 06/06/2021 9:15 AM Medical Record Number: [id number] Patient Account Number: [id number] Date of Birth/Sex: 27-Jun-1946 7(75 y.o. F) Treating RN: Rogers BlockerSanchez, Kenia Primary Care Provider: Elizabeth SauerJones, Deanna Other Clinician: Referring Provider: Elizabeth SauerJones, Deanna Treating Provider/Extender: Allen DerryStone, Bushra Denman Weeks in Treatment: 1 History of Present Illness HPI Description: 05/30/2021 upon evaluation today patient presents for initial inspection here in our clinic concerning issues that she has been having with 2 wounds currently which are on her left lower leg. She notes that one of them was a definitive tick that she removed and then had to reaction from that the second is a spot that looks identical but she did not actually identify a specific tick she had a scab that she pulled off but never found a tick within the scab that she could tell anyway. Fortunately there does not appear to be any signs of active infection systemically at this time which is great news. No fevers, chills, nausea, vomiting, or diarrhea. With that being said unfortunately she is having a lot of irritation including itching  around the edges of the wound and even pain as well in regard to the anterior wound. 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 She is not extremely swollen however at this point. She also does have emphysema but otherwise no major medical problems that would affect her wound healing. She is concerned about the tick bites as her husband apparently has had some issues with tick fevers in the past. She does not want this to turn into anything more significant. The patient currently has been using salt water to clean the area with along with Polysporin and Neosporin and peroxide as well. She tells me that this initially occurred on May 08, 2021. She has not really been keeping it covered it is somewhat scabbed to some degree today. 06/06/2021 upon evaluation today patient presents for reevaluation today regarding her wounds on her legs. Both are doing better they seem to be less inflamed and overall she seems to be greatly improved. She tells me this is not hurting or itching nearly as much as it was last time I saw her. Electronic Signature(s) Signed: 06/06/2021 7:19:26 PM By: Lenda KelpStone III, Tama Grosz PA-C Entered By: Lenda KelpStone III, Malania Gawthrop on 06/06/2021 19:19:26 Reinitz, Arabel Demetrius CharityP. ([id number]) -------------------------------------------------------------------------------- Physical Exam Details Patient Name: [name], [name] Date of Service: 06/06/2021 9:15 AM Medical Record Number: [id number] Patient Account Number: [id number] Date of Birth/Sex: 27-Jun-1946 2(75 y.o. F) Treating RN: Rogers BlockerSanchez, Kenia Primary Care Provider: Elizabeth SauerJones, Deanna Other Clinician: Referring Provider: Elizabeth SauerJones, Deanna Treating Provider/Extender: Allen DerryStone, Mac Dowdell Weeks in Treatment: 1 Constitutional Well-nourished and well-hydrated in no acute distress. Respiratory normal breathing without difficulty. Psychiatric this patient is able to make decisions and demonstrates good insight into disease process. Alert  and Oriented x 3. pleasant and cooperative. Notes Upon inspection patient's wound bed actually showed signs of  good granulation and epithelization at this point. There does not appear to be any signs of infection which is great news and overall very pleased with where things stand today. I do believe the triamcinolone has been beneficial for her. The collagen also think is good although I think we need to moisten this with saline as the ointment itself is not really providing enough moisture. Electronic Signature(s) Signed: 06/06/2021 7:20:28 PM By: Lenda Kelp PA-C Entered By: Lenda Kelp on 06/06/2021 19:20:27 Music, Lilli Light ([id number]) -------------------------------------------------------------------------------- Physician Orders Details Patient Name: [name], [name] Date of Service: 06/06/2021 9:15 AM Medical Record Number: [id number] Patient Account Number: [id number] Date of Birth/Sex: [birth year] (74 y.o. F) Treating RN: Rogers Blocker Primary Care Provider: Elizabeth Sauer Other Clinician: Referring Provider: Elizabeth Sauer Treating Provider/Extender: Rowan Blase in Treatment: 1 Verbal / Phone Orders: No Diagnosis Coding ICD-10 Coding Code Description [phone] Varicose veins of bilateral lower extremities with other complications I87.2 Venous insufficiency (chronic) (peripheral) L97.822 Non-pressure chronic ulcer of other part of left lower leg with fat layer exposed J43.9 Emphysema, unspecified Follow-up Appointments o Return Appointment in 1 week. Bathing/ Shower/ Hygiene o May shower; gently cleanse wound with antibacterial soap, rinse and pat dry prior to dressing wounds Wound Treatment Wound #1 - Lower Leg Wound Laterality: Left, Medial Cleanser: Soap and Water 3 x Per Week/30 Days Discharge Instructions: Gently cleanse wound with antibacterial soap, rinse and pat dry prior to dressing wounds Topical: Triamcinolone Acetonide Cream, 0.1%, 15 (g) tube 3 x  Per Week/30 Days Discharge Instructions: Apply around the wound Primary Dressing: Prisma 4.34 (in) (Generic) 3 x Per Week/30 Days Discharge Instructions: Moisten w/normal saline or sterile water; Cover wound as directed. Do not remove from wound bed. Secondary Dressing: Mepilex Border Flex, 4x4 (in/in) (Generic) 3 x Per Week/30 Days Discharge Instructions: Apply to wound as directed. Do not cut. Secured With: Tubigrip Size D, 3x10 (in/yd) 3 x Per Week/30 Days Discharge Instructions: Apply over dressings Wound #2 - Lower Leg Wound Laterality: Left, Anterior Cleanser: Soap and Water 3 x Per Week/30 Days Discharge Instructions: Gently cleanse wound with antibacterial soap, rinse and pat dry prior to dressing wounds Topical: Triamcinolone Acetonide Cream, 0.1%, 15 (g) tube 3 x Per Week/30 Days Discharge Instructions: Apply around the wound Primary Dressing: Prisma 4.34 (in) (Generic) 3 x Per Week/30 Days Discharge Instructions: Moisten w/normal saline or sterile water; Cover wound as directed. Do not remove from wound bed. Secondary Dressing: Mepilex Border Flex, 4x4 (in/in) (Generic) 3 x Per Week/30 Days Discharge Instructions: Apply to wound as directed. Do not cut. Secured With: Tubigrip Size D, 3x10 (in/yd) 3 x Per Week/30 Days Discharge Instructions: Apply over dressings Electronic Signature(s) Signed: 06/06/2021 4:15:25 PM By: Phillis Haggis, Dondra Prader RN Signed: 06/06/2021 7:32:25 PM By: Lenda Kelp PA-C Entered By: Phillis Haggis, Dondra Prader on 06/06/2021 09:46:36 Stangl, Lilli Light ([id number]) Derenzo, Sakari P. ([id number]) -------------------------------------------------------------------------------- Problem List Details Patient Name: Kracht, Celines P. Date of Service: 06/06/2021 9:15 AM Medical Record Number: [id number] Patient Account Number: [id number] Date of Birth/Sex: 05-29-1946 (75 y.o. F) Treating RN: Rogers Blocker Primary Care Provider: Elizabeth Sauer Other Clinician: Referring  Provider: Elizabeth Sauer Treating Provider/Extender: Allen Derry Weeks in Treatment: 1 Active Problems ICD-10 Encounter Code Description Active Date MDM Diagnosis [phone] Varicose veins of bilateral lower extremities with other complications 05/30/2021 No Yes I87.2 Venous insufficiency (chronic) (peripheral) 05/30/2021 No Yes L97.822 Non-pressure chronic ulcer of other part of left lower leg with fat layer 05/30/2021 No Yes exposed  J43.9 Emphysema, unspecified 05/30/2021 No Yes Inactive Problems Resolved Problems Electronic Signature(s) Signed: 06/06/2021 9:40:08 AM By: Lenda Kelp PA-C Entered By: Lenda Kelp on 06/06/2021 09:40:08 Bevens, Keydi P. ([id number]) -------------------------------------------------------------------------------- Progress Note Details Patient Name: Marner, Nasya P. Date of Service: 06/06/2021 9:15 AM Medical Record Number: [id number] Patient Account Number: [id number] Date of Birth/Sex: Dec 03, 1946 (75 y.o. F) Treating RN: Rogers Blocker Primary Care Provider: Elizabeth Sauer Other Clinician: Referring Provider: Elizabeth Sauer Treating Provider/Extender: Allen Derry Weeks in Treatment: 1 Subjective Chief Complaint Information obtained from Patient Left LE Ulcers following tick bites History of Present Illness (HPI) 05/30/2021 upon evaluation today patient presents for initial inspection here in our clinic concerning issues that she has been having with 2 wounds currently which are on her left lower leg. She notes that one of them was a definitive tick that she removed and then had to reaction from that the second is a spot that looks identical but she did not actually identify a specific tick she had a scab that she pulled off but never found a tick within the scab that she could tell anyway. Fortunately there does not appear to be any signs of active infection systemically at this time which is great news. No fevers, chills, nausea, vomiting, or diarrhea.  With that being said unfortunately she is having a lot of irritation including itching around the edges of the wound and even pain as well in regard to the anterior wound. 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 She is not extremely swollen however at this point. She also does have emphysema but otherwise no major medical problems that would affect her wound healing. She is concerned about the tick bites as her husband apparently has had some issues with tick fevers in the past. She does not want this to turn into anything more significant. The patient currently has been using salt water to clean the area with along with Polysporin and Neosporin and peroxide as well. She tells me that this initially occurred on May 08, 2021. She has not really been keeping it covered it is somewhat scabbed to some degree today. 06/06/2021 upon evaluation today patient presents for reevaluation today regarding her wounds on her legs. Both are doing better they seem to be less inflamed and overall she seems to be greatly improved. She tells me this is not hurting or itching nearly as much as it was last time I saw her. Objective Constitutional Well-nourished and well-hydrated in no acute distress. Vitals Time Taken: 9:00 AM, Height: 64 in, Weight: 135 lbs, BMI: 23.2, Temperature: 98.4 F, Pulse: 74 bpm, Respiratory Rate: 16 breaths/min, Blood Pressure: 124/62 mmHg. Respiratory normal breathing without difficulty. Psychiatric this patient is able to make decisions and demonstrates good insight into disease process. Alert and Oriented x 3. pleasant and cooperative. General Notes: Upon inspection patient's wound bed actually showed signs of good granulation and epithelization at this point. There does not appear to be any signs of infection which is great news and overall very pleased with where things stand today. I do believe the triamcinolone has been beneficial  for her. The collagen also think is good although I think we need to moisten this with saline as the ointment itself is not really providing enough moisture. Integumentary (Hair, Skin) Wound #1 status is Open. Original cause of wound was Bite. The date acquired was: 05/08/2021. The wound has been in treatment 1 weeks. The wound is located  on the Left,Medial Lower Leg. The wound measures 0.3cm length x 0.3cm width x 0.2cm depth; 0.071cm^2 area and 0.014cm^3 volume. There is Fat Layer (Subcutaneous Tissue) exposed. There is no tunneling or undermining noted. There is a medium amount of sanguinous drainage noted. There is no granulation within the wound bed. There is a large (67-100%) amount of necrotic tissue within the wound bed including Adherent Slough. Wound #2 status is Open. Original cause of wound was Bite. The date acquired was: 05/08/2021. The wound has been in treatment 1 weeks. The wound is located on the Left,Anterior Lower Leg. The wound measures 0.4cm length x 0.4cm width x 0.2cm depth; 0.126cm^2 area and 0.025cm^3 volume. There is Fat Layer (Subcutaneous Tissue) exposed. There is no tunneling or undermining noted. There is a medium amount of sanguinous drainage noted. There is medium (34-66%) red granulation within the wound bed. There is a medium (34-66%) amount of necrotic Rossner, Saharah P. ([id number]) tissue within the wound bed including Adherent Slough. Assessment Active Problems ICD-10 Varicose veins of bilateral lower extremities with other complications Venous insufficiency (chronic) (peripheral) Non-pressure chronic ulcer of other part of left lower leg with fat layer exposed Emphysema, unspecified Plan Follow-up Appointments: Return Appointment in 1 week. Bathing/ Shower/ Hygiene: May shower; gently cleanse wound with antibacterial soap, rinse and pat dry prior to dressing wounds WOUND #1: - Lower Leg Wound Laterality: Left, Medial Cleanser: Soap and Water 3 x Per  Week/30 Days Discharge Instructions: Gently cleanse wound with antibacterial soap, rinse and pat dry prior to dressing wounds Topical: Triamcinolone Acetonide Cream, 0.1%, 15 (g) tube 3 x Per Week/30 Days Discharge Instructions: Apply around the wound Primary Dressing: Prisma 4.34 (in) (Generic) 3 x Per Week/30 Days Discharge Instructions: Moisten w/normal saline or sterile water; Cover wound as directed. Do not remove from wound bed. Secondary Dressing: Mepilex Border Flex, 4x4 (in/in) (Generic) 3 x Per Week/30 Days Discharge Instructions: Apply to wound as directed. Do not cut. Secured With: Tubigrip Size D, 3x10 (in/yd) 3 x Per Week/30 Days Discharge Instructions: Apply over dressings WOUND #2: - Lower Leg Wound Laterality: Left, Anterior Cleanser: Soap and Water 3 x Per Week/30 Days Discharge Instructions: Gently cleanse wound with antibacterial soap, rinse and pat dry prior to dressing wounds Topical: Triamcinolone Acetonide Cream, 0.1%, 15 (g) tube 3 x Per Week/30 Days Discharge Instructions: Apply around the wound Primary Dressing: Prisma 4.34 (in) (Generic) 3 x Per Week/30 Days Discharge Instructions: Moisten w/normal saline or sterile water; Cover wound as directed. Do not remove from wound bed. Secondary Dressing: Mepilex Border Flex, 4x4 (in/in) (Generic) 3 x Per Week/30 Days Discharge Instructions: Apply to wound as directed. Do not cut. Secured With: Tubigrip Size D, 3x10 (in/yd) 3 x Per Week/30 Days Discharge Instructions: Apply over dressings 1. Would recommend currently we continue to apply thin film of the triamcinolone to the wound beds. Subsequently she will then over top of this have the collagen which she should moisten and then applied to the wound bed. 2. I am also can recommend that she continue to monitor for any signs of worsening or infection and I see nothing right now that seems to be problematic. 3. She will cover this with a border foam dressing followed by  Tubigrip size D. We will see patient back for reevaluation in 1 week here in the clinic. If anything worsens or changes patient will contact our office for additional recommendations. Electronic Signature(s) Signed: 06/06/2021 7:21:09 PM By: Lenda Kelp PA-C Entered By: Larina Bras  III, Kenya Kook on 06/06/2021 19:21:09 Servidio, Cherish P. ([id number]) -------------------------------------------------------------------------------- SuperBill Details Patient Name: Knieriem, Liyah P. Date of Service: 06/06/2021 Medical Record Number: [id number] Patient Account Number: [id number] Date of Birth/Sex: [birth year] (75 y.o. F) Treating RN: Rogers Blocker Primary Care Provider: Elizabeth Sauer Other Clinician: Referring Provider: Elizabeth Sauer Treating Provider/Extender: Allen Derry Weeks in Treatment: 1 Diagnosis Coding ICD-10 Codes Code Description [phone] Varicose veins of bilateral lower extremities with other complications I87.2 Venous insufficiency (chronic) (peripheral) L97.822 Non-pressure chronic ulcer of other part of left lower leg with fat layer exposed J43.9 Emphysema, unspecified Facility Procedures CPT4 Code: 80165537 Description: 99213 - WOUND CARE VISIT-LEV 3 EST PT Modifier: Quantity: 1 Physician Procedures CPT4 Code: 4827078 Description: 99214 - WC PHYS LEVEL 4 - EST PT Modifier: Quantity: 1 CPT4 Code: Description: ICD-10 Diagnosis Description I83.893 Varicose veins of bilateral lower extremities with other complications I87.2 Venous insufficiency (chronic) (peripheral) L97.822 Non-pressure chronic ulcer of other part of left lower leg with fat lay  J43.9 Emphysema, unspecified Modifier: er exposed Quantity: Electronic Signature(s) Signed: 06/06/2021 7:22:01 PM By: Lenda Kelp PA-C Previous Signature: 06/06/2021 4:15:25 PM Version By: Phillis Haggis, Dondra Prader RN Entered By: Lenda Kelp on 06/06/2021 19:22:00

## 2021-06-06 NOTE — Progress Notes (Signed)
Kaitlyn, [name] ([id number]) Visit Report for 06/06/2021 Arrival Information Details Patient Name: [name], [name]. Date of Service: 06/06/2021 9:15 AM Medical Record Number: [id number] Patient Account Number: [id number] Date of Birth/Sex: 02/11/1946 (75 y.o. F) Treating [name]: [name] Primary Care [name]: [name] Other Clinician: Referring [name]: [name] Treating [name]/Extender: [name] in Treatment: 1 Visit Information History Since Last Visit Added or deleted any medications: No Patient Arrived: Ambulatory Had a fall or experienced change in No Arrival Time: 08:58 activities of daily living that may affect Accompanied By: self risk of falls: Transfer Assistance: None Has Dressing in Place as Prescribed: Yes Patient Identification Verified: Yes Pain Present Now: No Secondary Verification Process Completed: Yes Patient Requires Transmission-Based Precautions: No Patient Has Alerts: Yes Patient Alerts: NOT diabetic Electronic Signature(s) Signed: 06/06/2021 3:40:04 PM By: [name] Entered By: [name] on 06/06/2021 09:00:29 [name] ([id number]) -------------------------------------------------------------------------------- Clinic Level of Care Assessment Details Patient Name: [name], [name] Date of Service: 06/06/2021 9:15 AM Medical Record Number: [id number] Patient Account Number: [id number] Date of Birth/Sex: 11/01/1946 (75 y.o. F) Treating [name]: [name] Primary Care Janera Peugh: [name] Other Clinician: Referring [name]: [name] Treating [name]/Extender: [name] in Treatment: 1 Clinic Level of Care Assessment Items TOOL 4 Quantity Score X - Use when only an EandM is performed on FOLLOW-UP visit 1 0 ASSESSMENTS - Nursing Assessment / Reassessment X - Reassessment of Co-morbidities (includes updates in patient status) 1 10 X- 1 5 Reassessment of Adherence to Treatment Plan ASSESSMENTS - Wound and Skin  Assessment / Reassessment []  - Simple Wound Assessment / Reassessment - one wound 0 X- 2 5 Complex Wound Assessment / Reassessment - multiple wounds []  - 0 Dermatologic / Skin Assessment (not related to wound area) ASSESSMENTS - Focused Assessment []  - Circumferential Edema Measurements - multi extremities 0 []  - 0 Nutritional Assessment / Counseling / Intervention []  - 0 Lower Extremity Assessment (monofilament, tuning fork, pulses) []  - 0 Peripheral Arterial Disease Assessment (using hand held doppler) ASSESSMENTS - Ostomy and/or Continence Assessment and Care []  - Incontinence Assessment and Management 0 []  - 0 Ostomy Care Assessment and Management (repouching, etc.) PROCESS - Coordination of Care X - Simple Patient / Family Education for ongoing care 1 15 []  - 0 Complex (extensive) Patient / Family Education for ongoing care []  - 0 Staff obtains Programmer, systems, Records, Test Results / Process Orders []  - 0 Staff telephones HHA, Nursing Homes / Clarify orders / etc []  - 0 Routine Transfer to another Facility (non-emergent condition) []  - 0 Routine Hospital Admission (non-emergent condition) []  - 0 New Admissions / Biomedical engineer / Ordering NPWT, Apligraf, etc. []  - 0 Emergency Hospital Admission (emergent condition) X- 1 10 Simple Discharge Coordination []  - 0 Complex (extensive) Discharge Coordination PROCESS - Special Needs []  - Pediatric / Minor Patient Management 0 []  - 0 Isolation Patient Management []  - 0 Hearing / Language / Visual special needs []  - 0 Assessment of Community assistance (transportation, D/C planning, etc.) []  - 0 Additional assistance / Altered mentation []  - 0 Support Surface(s) Assessment (bed, cushion, seat, etc.) INTERVENTIONS - Wound Cleansing / Measurement Panozzo, Abrea P. ([id number]) []  - 0 Simple Wound Cleansing - one wound X- 2 5 Complex Wound Cleansing - multiple wounds X- 1 5 Wound Imaging (photographs - any number of  wounds) []  - 0 Wound Tracing (instead of photographs) []  - 0 Simple Wound Measurement - one wound X- 2 5 Complex Wound Measurement -  multiple wounds INTERVENTIONS - Wound Dressings $RemoveBeforeD'[]'UAuSBdujBupiKv$  - Small Wound Dressing one or multiple wounds 0 X- 1 15 Medium Wound Dressing one or multiple wounds $RemoveBeforeD'[]'YjwFuoUbwvbmBy$  - 0 Large Wound Dressing one or multiple wounds $RemoveBeforeD'[]'fwVVNgmCgboCvF$  - 0 Application of Medications - topical $RemoveB'[]'COujHpYk$  - 0 Application of Medications - injection INTERVENTIONS - Miscellaneous $RemoveBeforeD'[]'mizUIuyBGKehkx$  - External ear exam 0 $Remo'[]'VCMMD$  - 0 Specimen Collection (cultures, biopsies, blood, body fluids, etc.) $RemoveBefor'[]'DXQFMinYDcLQ$  - 0 Specimen(s) / Culture(s) sent or taken to Lab for analysis $RemoveBefo'[]'lhcxhOTWDkd$  - 0 Patient Transfer (multiple staff / Civil Service fast streamer / Similar devices) $RemoveBeforeDE'[]'yIbOqoznvvlagIq$  - 0 Simple Staple / Suture removal (25 or less) $Remove'[]'CRiMJrR$  - 0 Complex Staple / Suture removal (26 or more) $Remove'[]'HcrzGaL$  - 0 Hypo / Hyperglycemic Management (close monitor of Blood Glucose) $RemoveBefore'[]'dKdQLVPepcqaQ$  - 0 Ankle / Brachial Index (ABI) - do not check if billed separately X- 1 5 Vital Signs Has the patient been seen at the hospital within the last three years: Yes Total Score: 95 Level Of Care: New/Established - Level 3 Electronic Signature(s) Signed: 06/06/2021 4:15:25 PM By: [name] Entered By: Kaitlyn Mouse, Kenia on 06/06/2021 09:45:41 Albert, [name] ([id number]) -------------------------------------------------------------------------------- Encounter Discharge Information Details Patient Name: [name], [name] Date of Service: 06/06/2021 9:15 AM Medical Record Number: [id number] Patient Account Number: [id number] Date of Birth/Sex: 1946/09/03 (75 y.o. F) Treating [name]: [name] Primary Care [name]: [name] Other Clinician: Referring [name]: [name] Treating Kaitlyn Radick/Extender: [name] in Treatment: 1 Encounter Discharge Information Items Discharge Condition: Stable Ambulatory Status: Ambulatory Discharge Destination: Home Transportation: Private  Auto Accompanied By: self Schedule Follow-up Appointment: Yes Clinical Summary of Care: Electronic Signature(s) Signed: 06/06/2021 3:40:04 PM By: [name] Entered By: [name] on 06/06/2021 09:55:18 Asato, Clarice P. ([id number]) -------------------------------------------------------------------------------- Lower Extremity Assessment Details Patient Name: [name], [name] Date of Service: 06/06/2021 9:15 AM Medical Record Number: [id number] Patient Account Number: [id number] Date of Birth/Sex: 1946-04-22 (75 y.o. F) Treating [name]: [name] Primary Care Porschea Borys: [name] Other Clinician: Referring Yvette Roark: [name] Treating Machell Wirthlin/Extender: Jeri Cos Weeks in Treatment: 1 Edema Assessment Assessed: [Left: Yes] [Right: No] Edema: [Left: N] [Right: o] Calf Left: Right: Point of Measurement: 33 cm From Medial Instep 33.5 cm Ankle Left: Right: Point of Measurement: 10 cm From Medial Instep 20 cm Vascular Assessment Pulses: Dorsalis Pedis Palpable: [Left:Yes] Electronic Signature(s) Signed: 06/06/2021 3:40:04 PM By: [name] Entered By: [name] on 06/06/2021 09:06:05 Mcmurphy, Nafisah P. ([id number]) -------------------------------------------------------------------------------- Multi Wound Chart Details Patient Name: [name], [name] Date of Service: 06/06/2021 9:15 AM Medical Record Number: [id number] Patient Account Number: [id number] Date of Birth/Sex: 10/03/1946 (75 y.o. F) Treating [name]: [name] Primary Care Juandedios Dudash: [name] Other Clinician: Referring Brylee Mcgreal: [name] Treating Cynia Abruzzo/Extender: Jeri Cos Weeks in Treatment: 1 Vital Signs Height(in): 30 Pulse(bpm): 76 Weight(lbs): 135 Blood Pressure(mmHg): 124/62 Body Mass Index(BMI): 23 Temperature(F): 98.4 Respiratory Rate(breaths/min): 16 Photos: [N/A:N/A] Wound Location: Left, Medial Lower Leg Left, Anterior Lower Leg N/A Wounding Event: Bite Bite N/A Primary Etiology:  Atypical Atypical N/A Comorbid History: Chronic Obstructive Pulmonary Chronic Obstructive Pulmonary N/A Disease (COPD) Disease (COPD) Date Acquired: 05/08/2021 05/08/2021 N/A Weeks of Treatment: 1 1 N/A Wound Status: Open Open N/A Measurements L x W x D (cm) 0.3x0.3x0.2 0.4x0.4x0.2 N/A Area (cm) : 0.071 0.126 N/A Volume (cm) : 0.014 0.025 N/A % Reduction in Area: 54.80% 55.50% N/A % Reduction in Volume: 54.80% 10.70% N/A Classification: Full Thickness Without Exposed Full Thickness Without Exposed N/A Support Structures Support Structures Exudate Amount: Medium  Medium N/A Exudate Type: Sanguinous Sanguinous N/A Exudate Color: red red N/A Granulation Amount: None Present (0%) Medium (34-66%) N/A Granulation Quality: N/A Red N/A Necrotic Amount: Large (67-100%) Medium (34-66%) N/A Exposed Structures: Fat Layer (Subcutaneous Tissue): Fat Layer (Subcutaneous Tissue): N/A Yes Yes Fascia: No Fascia: No Tendon: No Tendon: No Muscle: No Muscle: No Joint: No Joint: No Bone: No Bone: No Epithelialization: None None N/A Treatment Notes Electronic Signature(s) Signed: 06/06/2021 4:15:25 PM By: [name] Entered By: Kaitlyn Mouse, Minus Breeding on 06/06/2021 09:42:49 [name], [name] ([id number]) -------------------------------------------------------------------------------- Multi-Disciplinary Care Plan Details Patient Name: Bewley, Emmagrace P. Date of Service: 06/06/2021 9:15 AM Medical Record Number: [id number] Patient Account Number: [id number] Date of Birth/Sex: 05/22/1946 (75 y.o. F) Treating [name]: [name] Primary Care Pasqualino Witherspoon: [name] Other Clinician: Referring Cagney Degrace: [name] Treating Joell Usman/Extender: Jeri Cos Weeks in Treatment: 1 Active Inactive Wound/Skin Impairment Nursing Diagnoses: Impaired tissue integrity Goals: Patient/caregiver will verbalize understanding of skin care regimen Date Initiated: 05/30/2021 Date Inactivated:  06/06/2021 Target Resolution Date: 05/30/2021 Goal Status: Met Ulcer/skin breakdown will have a volume reduction of 30% by week 4 Date Initiated: 05/30/2021 Target Resolution Date: 06/29/2021 Goal Status: Active Ulcer/skin breakdown will have a volume reduction of 50% by week 8 Date Initiated: 05/30/2021 Target Resolution Date: 07/30/2021 Goal Status: Active Ulcer/skin breakdown will have a volume reduction of 80% by week 12 Date Initiated: 05/30/2021 Target Resolution Date: 08/30/2021 Goal Status: Active Ulcer/skin breakdown will heal within 14 weeks Date Initiated: 05/30/2021 Target Resolution Date: 09/29/2021 Goal Status: Active Interventions: Assess patient/caregiver ability to obtain necessary supplies Assess patient/caregiver ability to perform ulcer/skin care regimen upon admission and as needed Assess ulceration(s) every visit Provide education on ulcer and skin care Treatment Activities: Referred to DME Jovonne Wilton for dressing supplies : 05/30/2021 Skin care regimen initiated : 05/30/2021 Notes: Electronic Signature(s) Signed: 06/06/2021 4:15:25 PM By: [name] Entered By: Kaitlyn Mouse, Minus Breeding on 06/06/2021 09:42:36 [name], [name] ([id number]) -------------------------------------------------------------------------------- Pain Assessment Details Patient Name: [name], [name] Date of Service: 06/06/2021 9:15 AM Medical Record Number: [id number] Patient Account Number: [id number] Date of Birth/Sex: 1946/08/09 (75 y.o. F) Treating [name]: [name] Primary Care Neilah Fulwider: [name] Other Clinician: Referring Antoninette Lerner: [name] Treating Kimbella Heisler/Extender: Jeri Cos Weeks in Treatment: 1 Active Problems Location of Pain Severity and Description of Pain Patient Has Paino No Site Locations Rate the pain. Current Pain Level: 0 Pain Management and Medication Current Pain Management: Electronic Signature(s) Signed: 06/06/2021 3:40:04 PM By: [name] Entered By: [name] on 06/06/2021 09:01:54 [name], [name] ([id number]) -------------------------------------------------------------------------------- Patient/Caregiver Education Details Patient Name: [name], [name] Date of Service: 06/06/2021 9:15 AM Medical Record Number: [id number] Patient Account Number: [id number] Date of Birth/Gender: 10-Jul-1946 (75 y.o. F) Treating [name]: [name] Primary Care Physician: [name] Other Clinician: Referring Physician: Otilio [name] Treating Physician/Extender: [name] in Treatment: 1 Education Assessment Education Provided To: Patient Education Topics Provided Wound/Skin Impairment: Methods: Explain/Verbal Responses: State content correctly Electronic Signature(s) Signed: 06/06/2021 4:15:25 PM By: [name] Entered By: Kaitlyn Mouse, Minus Breeding on 06/06/2021 09:45:53 [name], [name] ([id number]) -------------------------------------------------------------------------------- Wound Assessment Details Patient Name: [name], [name] Date of Service: 06/06/2021 9:15 AM Medical Record Number: [id number] Patient Account Number: [id number] Date of Birth/Sex: 12-19-1945 (75 y.o. F) Treating [name]: [name] Primary Care Deidra Spease: [name] Other Clinician: Referring Krue Peterka: [name] Treating Jaryiah Mehlman/Extender: Jeri Cos Weeks in Treatment: 1 Wound Status Wound Number: 1 Primary Etiology: Atypical Wound Location: Left, Medial Lower Leg  Wound Status: Open Wounding Event: Bite Comorbid History: Chronic Obstructive Pulmonary Disease (COPD) Date Acquired: 05/08/2021 Weeks Of Treatment: 1 Clustered Wound: No Photos Wound Measurements Length: (cm) 0.3 Width: (cm) 0.3 Depth: (cm) 0.2 Area: (cm) 0.071 Volume: (cm) 0.014 % Reduction in Area: 54.8% % Reduction in Volume: 54.8% Epithelialization: None Tunneling: No Undermining: No Wound Description Classification: Full Thickness Without Exposed  Support Structu Exudate Amount: Medium Exudate Type: Sanguinous Exudate Color: red res Foul Odor After Cleansing: No Slough/Fibrino Yes Wound Bed Granulation Amount: None Present (0%) Exposed Structure Necrotic Amount: Large (67-100%) Fascia Exposed: No Necrotic Quality: Adherent Slough Fat Layer (Subcutaneous Tissue) Exposed: Yes Tendon Exposed: No Muscle Exposed: No Joint Exposed: No Bone Exposed: No Treatment Notes Wound #1 (Lower Leg) Wound Laterality: Left, Medial Cleanser Soap and Water Discharge Instruction: Gently cleanse wound with antibacterial soap, rinse and pat dry prior to dressing wounds Peri-Wound Care GEORGIE, EDUARDO. ([id number]) Topical Triamcinolone Acetonide Cream, 0.1%, 15 (g) tube Discharge Instruction: Apply around the wound Primary Dressing Prisma 4.34 (in) Discharge Instruction: Moisten w/normal saline or sterile water; Cover wound as directed. Do not remove from wound bed. Secondary Dressing Mepilex Border Flex, 4x4 (in/in) Discharge Instruction: Apply to wound as directed. Do not cut. Secured With Tubigrip Size D, 3x10 (in/yd) Discharge Instruction: Apply over dressings Compression Wrap Compression Stockings Add-Ons Electronic Signature(s) Signed: 06/06/2021 3:40:04 PM By: [name] Entered By: [name] on 06/06/2021 09:04:15 Rumble, Jadon P. ([id number]) -------------------------------------------------------------------------------- Wound Assessment Details Patient Name: [name], Zariyah P. Date of Service: 06/06/2021 9:15 AM Medical Record Number: [id number] Patient Account Number: [id number] Date of Birth/Sex: 12/02/1946 (75 y.o. F) Treating [name]: [name] Primary Care Zekiah Coen: [name] Other Clinician: Referring Harlee Pursifull: [name] Treating Vere Diantonio/Extender: Jeri Cos Weeks in Treatment: 1 Wound Status Wound Number: 2 Primary Etiology: Atypical Wound Location: Left, Anterior Lower Leg Wound Status: Open Wounding Event:  Bite Comorbid History: Chronic Obstructive Pulmonary Disease (COPD) Date Acquired: 05/08/2021 Weeks Of Treatment: 1 Clustered Wound: No Photos Wound Measurements Length: (cm) 0.4 Width: (cm) 0.4 Depth: (cm) 0.2 Area: (cm) 0.126 Volume: (cm) 0.025 % Reduction in Area: 55.5% % Reduction in Volume: 10.7% Epithelialization: None Tunneling: No Undermining: No Wound Description Classification: Full Thickness Without Exposed Support Structu Exudate Amount: Medium Exudate Type: Sanguinous Exudate Color: red res Foul Odor After Cleansing: No Slough/Fibrino Yes Wound Bed Granulation Amount: Medium (34-66%) Exposed Structure Granulation Quality: Red Fascia Exposed: No Necrotic Amount: Medium (34-66%) Fat Layer (Subcutaneous Tissue) Exposed: Yes Necrotic Quality: Adherent Slough Tendon Exposed: No Muscle Exposed: No Joint Exposed: No Bone Exposed: No Treatment Notes Wound #2 (Lower Leg) Wound Laterality: Left, Anterior Cleanser Soap and Water Discharge Instruction: Gently cleanse wound with antibacterial soap, rinse and pat dry prior to dressing wounds Peri-Wound Care NAHAL, WANLESS. ([id number]) Topical Triamcinolone Acetonide Cream, 0.1%, 15 (g) tube Discharge Instruction: Apply around the wound Primary Dressing Prisma 4.34 (in) Discharge Instruction: Moisten w/normal saline or sterile water; Cover wound as directed. Do not remove from wound bed. Secondary Dressing Mepilex Border Flex, 4x4 (in/in) Discharge Instruction: Apply to wound as directed. Do not cut. Secured With Tubigrip Size D, 3x10 (in/yd) Discharge Instruction: Apply over dressings Compression Wrap Compression Stockings Add-Ons Electronic Signature(s) Signed: 06/06/2021 3:40:04 PM By: [name] Entered By: [name] on 06/06/2021 09:05:11 Mawson, Tatisha P. ([id number]) -------------------------------------------------------------------------------- Vitals Details Patient Name: Metzger, Evora P. Date of  Service: 06/06/2021 9:15 AM Medical Record Number: [id number] Patient Account Number: [id number] Date of Birth/Sex: 1946/10/11 (75 y.o. F) Treating [name]: Lyndel Safe,  Joy Primary Care Jamise Pentland: [name] Other Clinician: Referring Camiya Vinal: [name] Treating Yashira Offenberger/Extender: Jeri Cos Weeks in Treatment: 1 Vital Signs Time Taken: 09:00 Temperature (F): 98.4 Height (in): 64 Pulse (bpm): 74 Weight (lbs): 135 Respiratory Rate (breaths/min): 16 Body Mass Index (BMI): 23.2 Blood Pressure (mmHg): 124/62 Reference Range: 80 - 120 mg / dl Electronic Signature(s) Signed: 06/06/2021 3:40:04 PM By: [name] Entered ByDonnamarie [name] on 06/06/2021 09:01:48

## 2021-06-13 ENCOUNTER — Encounter: Payer: Medicare Other | Admitting: Physician Assistant

## 2021-06-13 ENCOUNTER — Other Ambulatory Visit: Payer: Self-pay

## 2021-06-13 DIAGNOSIS — L97822 Non-pressure chronic ulcer of other part of left lower leg with fat layer exposed: Secondary | ICD-10-CM | POA: Diagnosis not present

## 2021-06-13 NOTE — Progress Notes (Signed)
[name] ([id number]) Visit Report for 06/13/2021 Chief Complaint Document Details Patient Name: Kaitlyn [name]. Date of Service: 06/13/2021 9:30 AM Medical Record Number: [id number] Patient Account Number: [id number] Date of Birth/Sex: October 01, 1946 (75 y.o. F) Treating RN: Rogers Blocker Primary Care Provider: Elizabeth Sauer Other Clinician: Referring Provider: Elizabeth Sauer Treating Provider/Extender: Rowan Blase in Treatment: 2 Information Obtained from: Patient Chief Complaint Left LE Ulcers following tick bites Electronic Signature(s) Signed: 06/13/2021 10:07:25 AM By: Lenda Kelp PA-C Entered By: Lenda Kelp on 06/13/2021 10:07:24 [name]. ([id number]) -------------------------------------------------------------------------------- HPI Details Patient Name: [name]. Date of Service: 06/13/2021 9:30 AM Medical Record Number: [id number] Patient Account Number: [id number] Date of Birth/Sex: [birth year] (75 y.o. F) Treating RN: Rogers Blocker Primary Care Provider: Elizabeth Sauer Other Clinician: Referring Provider: Elizabeth Sauer Treating Provider/Extender: Allen Derry Weeks in Treatment: 2 History of Present Illness HPI Description: 05/30/2021 upon evaluation today patient presents for initial inspection here in our clinic concerning issues that she has been having with 2 wounds currently which are on her left lower leg. She notes that one of them was a definitive tick that she removed and then had to reaction from that the second is a spot that looks identical but she did not actually identify a specific tick she had a scab that she pulled off but never found a tick within the scab that she could tell anyway. Fortunately there does not appear to be any signs of active infection systemically at this time which is great news. No fevers, chills, nausea, vomiting, or diarrhea. With that being said unfortunately she is having a lot of irritation including itching  around the edges of the wound and even pain as well in regard to the anterior wound. 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 She is not extremely swollen however at this point. She also does have emphysema but otherwise no major medical problems that would affect her wound healing. She is concerned about the tick bites as her husband apparently has had some issues with tick fevers in the past. She does not want this to turn into anything more significant. The patient currently has been using salt water to clean the area with along with Polysporin and Neosporin and peroxide as well. She tells me that this initially occurred on May 08, 2021. She has not really been keeping it covered it is somewhat scabbed to some degree today. 06/06/2021 upon evaluation today patient presents for reevaluation today regarding her wounds on her legs. Both are doing better they seem to be less inflamed and overall she seems to be greatly improved. She tells me this is not hurting or itching nearly as much as it was last time I saw her. 06/13/2021 upon evaluation today patient appears to be doing well. She is making progress. With that being said I do not think she is going to require the steroid ointment any longer. With that being said I do think that she would benefit from continuing the collagen I think that is can be the biggest thing try to get some granulation tissue filling in and helping to heal these areas. Electronic Signature(s) Signed: 06/13/2021 10:15:05 AM By: Lenda Kelp PA-C Entered By: Lenda Kelp on 06/13/2021 10:15:05 [name] ([id number]) -------------------------------------------------------------------------------- Physical Exam Details Patient Name: [name]. Date of Service: 06/13/2021 9:30 AM Medical Record Number: [id number] Patient Account Number: [id number] Date of Birth/Sex: [birth year] (75 y.o. F)  Treating RN: Rogers Blocker Primary Care Provider: Elizabeth Sauer Other Clinician: Referring Provider: Elizabeth Sauer Treating Provider/Extender: Allen Derry Weeks in Treatment: 2 Constitutional Well-nourished and well-hydrated in no acute distress. Respiratory normal breathing without difficulty. Psychiatric this patient is able to make decisions and demonstrates good insight into disease process. Alert and Oriented x 3. pleasant and cooperative. Notes Patient's wounds currently are showing some signs of good granulation starting the blood. She does have some epithelial growth as well and the inflammation is all better and very pleased no sharp debridement necessary today. Electronic Signature(s) Signed: 06/13/2021 10:15:58 AM By: Lenda Kelp PA-C Entered By: Lenda Kelp on 06/13/2021 10:15:57 [name] ([id number]) -------------------------------------------------------------------------------- Physician Orders Details Patient Name: [name]. Date of Service: 06/13/2021 9:30 AM Medical Record Number: [id number] Patient Account Number: [id number] Date of Birth/Sex: 1946-10-04 (75 y.o. F) Treating RN: Rogers Blocker Primary Care Provider: Elizabeth Sauer Other Clinician: Referring Provider: Elizabeth Sauer Treating Provider/Extender: Rowan Blase in Treatment: 2 Verbal / Phone Orders: No Diagnosis Coding Follow-up Appointments o Return Appointment in 2 weeks. Bathing/ Shower/ Hygiene o May shower; gently cleanse wound with antibacterial soap, rinse and pat dry prior to dressing wounds Wound Treatment Wound #1 - Lower Leg Wound Laterality: Left, Medial Cleanser: Soap and Water 3 x Per Week/30 Days Discharge Instructions: Gently cleanse wound with antibacterial soap, rinse and pat dry prior to dressing wounds Primary Dressing: Prisma 4.34 (in) (Generic) 3 x Per Week/30 Days Discharge Instructions: Moisten w/normal saline or sterile water; Cover wound as directed. Do not remove from  wound bed. Secondary Dressing: Mepilex Border Flex, 4x4 (in/in) (Generic) 3 x Per Week/30 Days Discharge Instructions: Apply to wound as directed. Do not cut. Secured With: Tubigrip Size D, 3x10 (in/yd) 3 x Per Week/30 Days Discharge Instructions: Apply over dressings Wound #2 - Lower Leg Wound Laterality: Left, Anterior Cleanser: Soap and Water 3 x Per Week/30 Days Discharge Instructions: Gently cleanse wound with antibacterial soap, rinse and pat dry prior to dressing wounds Primary Dressing: Prisma 4.34 (in) (Generic) 3 x Per Week/30 Days Discharge Instructions: Moisten w/normal saline or sterile water; Cover wound as directed. Do not remove from wound bed. Secondary Dressing: Mepilex Border Flex, 4x4 (in/in) (Generic) 3 x Per Week/30 Days Discharge Instructions: Apply to wound as directed. Do not cut. Secured With: Tubigrip Size D, 3x10 (in/yd) 3 x Per Week/30 Days Discharge Instructions: Apply over dressings Electronic Signature(s) Signed: 06/13/2021 10:08:16 AM By: Phillis Haggis, Dondra Prader RN Signed: 06/13/2021 4:56:00 PM By: Lenda Kelp PA-C Entered By: Phillis Haggis, Dondra Prader on 06/13/2021 10:08:16 Bankert, [name]. ([id number]) -------------------------------------------------------------------------------- Problem List Details Patient Name: Wilmarth, Yvett [name]. Date of Service: 06/13/2021 9:30 AM Medical Record Number: [id number] Patient Account Number: [id number] Date of Birth/Sex: Nov 13, 1946 (75 y.o. 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 ([id number]) -------------------------------------------------------------------------------- Progress Note Details Patient Name: Wisher, [name]. Date of Service: 06/13/2021 9:30 AM Medical  Record Number: [id number] Patient Account Number: [id number] Date of Birth/Sex: 02-01-1946 (75 y.o. F) Treating RN: Rogers Blocker Primary Care Provider: Elizabeth Sauer Other Clinician: Referring Provider: Elizabeth Sauer Treating Provider/Extender: Allen Derry Weeks in Treatment: 2 Subjective Chief Complaint Information obtained from Patient Left LE Ulcers following tick bites History of Present Illness (HPI) 05/30/2021 upon evaluation today patient presents for initial inspection here in our clinic concerning issues that she has been having with 2 wounds currently which are on her left lower leg. She notes that one of them was a definitive tick that she removed and then had to reaction from that the second is a spot that looks identical but she did not actually identify a specific tick she had a scab that she pulled off but never found a tick within the scab that she could tell anyway. Fortunately there does not appear to be any signs of active infection systemically at this time which is great news. No fevers, chills, nausea, vomiting, or diarrhea. With that being said unfortunately she is having a lot of irritation including itching around the edges of the wound and even pain as well in regard to the anterior wound. 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 She is not extremely swollen however at this point. She also does have emphysema but otherwise no major medical problems that would  affect her wound healing. She is concerned about the tick bites as her husband apparently has had some issues with tick fevers in the past. She does not want this to turn into anything more significant. The patient currently has been using salt water to clean the area with along with Polysporin and Neosporin and peroxide as well. She tells me that this initially occurred on May 08, 2021. She has not really been keeping it covered it is somewhat scabbed to some degree today. 06/06/2021 upon evaluation today patient presents for reevaluation today regarding her wounds on her legs. Both are doing better they seem to be less inflamed and overall she seems to be greatly improved. She tells me this is not hurting or itching nearly as much as it was last time I saw her. 06/13/2021 upon evaluation today patient appears to be doing well. She is making progress. With that being said I do not think she is going to require the steroid ointment any longer. With that being said I do think that she would benefit from continuing the collagen I think that is can be the biggest thing try to get some granulation tissue filling in and helping to heal these areas. Objective Constitutional Well-nourished and well-hydrated in no acute distress. Vitals Time Taken: 9:43 AM, Height: 64 in, Weight: 135 lbs, BMI: 23.2, Temperature: 98.4 F, Pulse: 79 bpm, Respiratory Rate: 18 breaths/min, Blood Pressure: 118/58 mmHg. Respiratory normal breathing without difficulty. Psychiatric this patient is able to make decisions and demonstrates good insight into disease process. Alert and Oriented x 3. pleasant and cooperative. General Notes: Patient's wounds currently are showing some signs of good granulation starting the blood. She does have some epithelial growth as well and the inflammation is all better and very pleased no sharp debridement necessary today. Integumentary (Hair, Skin) Wound #1 status is Open. Original cause of  wound was Bite. The date acquired was: 05/08/2021. The wound has been in treatment 2 weeks. The wound is located on the Left,Medial Lower Leg. The wound measures 0.2cm length x 0.2cm width x 0.1cm depth; 0.031cm^2 area and  0.003cm^3 volume. There is Fat Layer (Subcutaneous Tissue) exposed. There is no tunneling or undermining noted. There is a medium amount of serosanguineous drainage noted. There is no granulation within the wound bed. There is a large (67-100%) amount of necrotic tissue within the wound bed including Adherent Slough. Wound #2 status is Open. Original cause of wound was Bite. The date acquired was: 05/08/2021. The wound has been in treatment 2 weeks. The wound is located on the Left,Anterior Lower Leg. The wound measures 0.3cm length x 0.3cm width x 0.2cm depth; 0.071cm^2 area and 0.014cm^3 volume. There is Fat Layer (Subcutaneous Tissue) exposed. There is no tunneling or undermining noted. There is a medium amount Kaitlyn [name]. ([id number]) of sanguinous drainage noted. There is medium (34-66%) red granulation within the wound bed. There is a medium (34-66%) amount of necrotic tissue within the wound bed including Adherent Slough. Assessment Active Problems ICD-10 Varicose veins of bilateral lower extremities with other complications Venous insufficiency (chronic) (peripheral) Non-pressure chronic ulcer of other part of left lower leg with fat layer exposed Emphysema, unspecified Plan Follow-up Appointments: Return Appointment in 2 weeks. Bathing/ Shower/ Hygiene: May shower; gently cleanse wound with antibacterial soap, rinse and pat dry prior to dressing wounds WOUND #1: - Lower Leg Wound Laterality: Left, Medial Cleanser: Soap and Water 3 x Per Week/30 Days Discharge Instructions: Gently cleanse wound with antibacterial soap, rinse and pat dry prior to dressing wounds Primary Dressing: Prisma 4.34 (in) (Generic) 3 x Per Week/30 Days Discharge Instructions: Moisten  w/normal saline or sterile water; Cover wound as directed. Do not remove from wound bed. Secondary Dressing: Mepilex Border Flex, 4x4 (in/in) (Generic) 3 x Per Week/30 Days Discharge Instructions: Apply to wound as directed. Do not cut. Secured With: Tubigrip Size D, 3x10 (in/yd) 3 x Per Week/30 Days Discharge Instructions: Apply over dressings WOUND #2: - Lower Leg Wound Laterality: Left, Anterior Cleanser: Soap and Water 3 x Per Week/30 Days Discharge Instructions: Gently cleanse wound with antibacterial soap, rinse and pat dry prior to dressing wounds Primary Dressing: Prisma 4.34 (in) (Generic) 3 x Per Week/30 Days Discharge Instructions: Moisten w/normal saline or sterile water; Cover wound as directed. Do not remove from wound bed. Secondary Dressing: Mepilex Border Flex, 4x4 (in/in) (Generic) 3 x Per Week/30 Days Discharge Instructions: Apply to wound as directed. Do not cut. Secured With: Tubigrip Size D, 3x10 (in/yd) 3 x Per Week/30 Days Discharge Instructions: Apply over dressings 1. Would recommend currently that we going continue with the wound care measures as before with the silver collagen I think that still the best way to go. 2. I am also can recommend that we have the patient as well continue to change the dressing every few days I think this is appropriate hopefully that will keep this clean and healthy while it continues to heal. We will see patient back for reevaluation in 2 weeks here in the clinic. If anything worsens or changes patient will contact our office for additional recommendations. Electronic Signature(s) Signed: 06/13/2021 10:16:52 AM By: Lenda Kelp PA-C Entered By: Lenda Kelp on 06/13/2021 10:16:52 Kaitlyn [name] Kitchen ([id number]) -------------------------------------------------------------------------------- SuperBill Details Patient Name: Lupa, [name]. Date of Service: 06/13/2021 Medical Record Number: [id number] Patient Account Number:  [id number] Date of Birth/Sex: 1946/01/14 (75 y.o. F) Treating RN: Rogers Blocker Primary Care Provider: Elizabeth Sauer Other Clinician: Referring Provider: Elizabeth Sauer Treating Provider/Extender: Allen Derry Weeks in Treatment: 2 Diagnosis Coding ICD-10 Codes Code Description [phone] Varicose veins of bilateral lower extremities with  other complications I87.2 Venous insufficiency (chronic) (peripheral) L97.822 Non-pressure chronic ulcer of other part of left lower leg with fat layer exposed J43.9 Emphysema, unspecified Facility Procedures CPT4 Code: [id number] Description: 99213 - WOUND CARE VISIT-LEV 3 EST PT Modifier: Quantity: 1 Physician Procedures CPT4 Code: [id number] Description: 99213 - WC PHYS LEVEL 3 - EST PT Modifier: Quantity: 1 CPT4 Code: Description: ICD-10 Diagnosis Description I83.893 Varicose veins of bilateral lower extremities with other complications I87.2 Venous insufficiency (chronic) (peripheral) L97.822 Non-pressure chronic ulcer of other part of left lower leg with fat lay  J43.9 Emphysema, unspecified Modifier: er exposed Quantity: Electronic Signature(s) Signed: 06/13/2021 10:17:10 AM By: Lenda KelpStone III, [place] Dani PA-C Entered By: Lenda KelpStone III, Norvell Ureste on 06/13/2021 10:17:09

## 2021-06-16 NOTE — Progress Notes (Signed)
[name] ([id number]) Visit Report for 06/13/2021 Arrival Information Details Patient Name: [name], [name]. Date of Service: 06/13/2021 9:30 AM Medical Record Number: [id number] Patient Account Number: [id number] Date of Birth/Sex: 1946-02-03 (75 y.o. F) Treating RN: Carlene Coria Primary Care Marry Kusch: Otilio Miu Other Clinician: Referring Iyauna Sing: Otilio Miu Treating Weslyn Holsonback/Extender: Skipper Cliche in Treatment: 2 Visit Information History Since Last Visit All ordered tests and consults were completed: No Patient Arrived: Ambulatory Added or deleted any medications: No Arrival Time: 09:40 Any new allergies or adverse reactions: No Accompanied By: self Had a fall or experienced change in No Transfer Assistance: None activities of daily living that may affect Patient Identification Verified: Yes risk of falls: Secondary Verification Process Completed: Yes Signs or symptoms of abuse/neglect since last visito No Patient Requires Transmission-Based Precautions: No Hospitalized since last visit: No Patient Has Alerts: Yes Implantable device outside of the clinic excluding No Patient Alerts: NOT diabetic cellular tissue based products placed in the center since last visit: Has Dressing in Place as Prescribed: Yes Has Compression in Place as Prescribed: Yes Pain Present Now: No Electronic Signature(s) Signed: 06/15/2021 6:25:03 PM By: Carlene Coria RN Entered By: Carlene Coria on 06/13/2021 09:43:56 Able, Parrie P. ([id number]) -------------------------------------------------------------------------------- Clinic Level of Care Assessment Details Patient Name: [name]. Date of Service: 06/13/2021 9:30 AM Medical Record Number: [id number] Patient Account Number: [id number] Date of Birth/Sex: 08-27-1946 (75 y.o. F) Treating RN: Dolan Amen Primary Care Marnette Perkins: Otilio Miu Other Clinician: Referring Kalisi Bevill: Otilio Miu Treating Chanan Detwiler/Extender: Skipper Cliche in Treatment: 2 Clinic Level of Care Assessment Items TOOL 4 Quantity Score X - Use when only an EandM is performed on FOLLOW-UP visit 1 0 ASSESSMENTS - Nursing Assessment / Reassessment X - Reassessment of Co-morbidities (includes updates in patient status) 1 10 X- 1 5 Reassessment of Adherence to Treatment Plan ASSESSMENTS - Wound and Skin Assessment / Reassessment []  - Simple Wound Assessment / Reassessment - one wound 0 X- 2 5 Complex Wound Assessment / Reassessment - multiple wounds []  - 0 Dermatologic / Skin Assessment (not related to wound area) ASSESSMENTS - Focused Assessment []  - Circumferential Edema Measurements - multi extremities 0 []  - 0 Nutritional Assessment / Counseling / Intervention []  - 0 Lower Extremity Assessment (monofilament, tuning fork, pulses) []  - 0 Peripheral Arterial Disease Assessment (using hand held doppler) ASSESSMENTS - Ostomy and/or Continence Assessment and Care []  - Incontinence Assessment and Management 0 []  - 0 Ostomy Care Assessment and Management (repouching, etc.) PROCESS - Coordination of Care X - Simple Patient / Family Education for ongoing care 1 15 []  - 0 Complex (extensive) Patient / Family Education for ongoing care []  - 0 Staff obtains Programmer, systems, Records, Test Results / Process Orders []  - 0 Staff telephones HHA, Nursing Homes / Clarify orders / etc []  - 0 Routine Transfer to another Facility (non-emergent condition) []  - 0 Routine Hospital Admission (non-emergent condition) []  - 0 New Admissions / Biomedical engineer / Ordering NPWT, Apligraf, etc. []  - 0 Emergency Hospital Admission (emergent condition) X- 1 10 Simple Discharge Coordination []  - 0 Complex (extensive) Discharge Coordination PROCESS - Special Needs []  - Pediatric / Minor Patient Management 0 []  - 0 Isolation Patient Management []  - 0 Hearing / Language / Visual special needs []  - 0 Assessment of Community assistance  (transportation, D/C planning, etc.) []  - 0 Additional assistance / Altered mentation []  - 0 Support Surface(s) Assessment (bed, cushion, seat, etc.) INTERVENTIONS - Wound Cleansing / Measurement Whelchel,  Micaylah P. ([id number]) $RemoveBefore'[]'OrpsdSwQaPPKz$  - 0 Simple Wound Cleansing - one wound X- 2 5 Complex Wound Cleansing - multiple wounds X- 1 5 Wound Imaging (photographs - any number of wounds) $RemoveBe'[]'qCYbOcKJS$  - 0 Wound Tracing (instead of photographs) $RemoveBeforeD'[]'GJJHcqVSjEgwHe$  - 0 Simple Wound Measurement - one wound X- 2 5 Complex Wound Measurement - multiple wounds INTERVENTIONS - Wound Dressings $RemoveBeforeD'[]'KPQNmeelUsEMUM$  - Small Wound Dressing one or multiple wounds 0 X- 1 15 Medium Wound Dressing one or multiple wounds $RemoveBeforeD'[]'gyssfUCMWrTyuM$  - 0 Large Wound Dressing one or multiple wounds $RemoveBeforeD'[]'RfDBiGzXekEEgM$  - 0 Application of Medications - topical $RemoveB'[]'VRAzNzcR$  - 0 Application of Medications - injection INTERVENTIONS - Miscellaneous $RemoveBeforeD'[]'rYQXDQklUkyYpH$  - External ear exam 0 $Remo'[]'yoPIY$  - 0 Specimen Collection (cultures, biopsies, blood, body fluids, etc.) $RemoveBefor'[]'pdkJeRhrgjhn$  - 0 Specimen(s) / Culture(s) sent or taken to Lab for analysis $RemoveBefo'[]'ifJJoVkkGKx$  - 0 Patient Transfer (multiple staff / Civil Service fast streamer / Similar devices) $RemoveBeforeDE'[]'GurPxuntYLUdfRL$  - 0 Simple Staple / Suture removal (25 or less) $Remove'[]'SnwMrau$  - 0 Complex Staple / Suture removal (26 or more) $Remove'[]'NTwDuzZ$  - 0 Hypo / Hyperglycemic Management (close monitor of Blood Glucose) $RemoveBefore'[]'RnrqioplEGPHt$  - 0 Ankle / Brachial Index (ABI) - do not check if billed separately X- 1 5 Vital Signs Has the patient been seen at the hospital within the last three years: Yes Total Score: 95 Level Of Care: New/Established - Level 3 Electronic Signature(s) Signed: 06/13/2021 4:55:48 PM By: Georges Mouse, Minus Breeding RN Entered By: Georges Mouse, Kenia on 06/13/2021 10:04:56 Shuler, Talea Mamie Nick ([id number]) -------------------------------------------------------------------------------- Lower Extremity Assessment Details Patient Name: Hoganson, Mahika P. Date of Service: 06/13/2021 9:30 AM Medical Record Number: [id number] Patient Account Number: [id number] Date of  Birth/Sex: 09/17/1946 (75 y.o. F) Treating RN: Carlene Coria Primary Care Lajean Boese: Otilio Miu Other Clinician: Referring Alyxander Kollmann: Otilio Miu Treating Kathlynn Swofford/Extender: Jeri Cos Weeks in Treatment: 2 Edema Assessment Assessed: [Left: No] [Right: No] Edema: [Left: Ye] [Right: s] Calf Left: Right: Point of Measurement: 33 cm From Medial Instep 32 cm Ankle Left: Right: Point of Measurement: 10 cm From Medial Instep 20 cm Vascular Assessment Pulses: Dorsalis Pedis Palpable: [Left:Yes] Electronic Signature(s) Signed: 06/15/2021 6:25:03 PM By: Carlene Coria RN Entered By: Carlene Coria on 06/13/2021 09:51:09 Ganci, Lynzy P. ([id number]) -------------------------------------------------------------------------------- Multi Wound Chart Details Patient Name: Sheffer, Camron P. Date of Service: 06/13/2021 9:30 AM Medical Record Number: [id number] Patient Account Number: [id number] Date of Birth/Sex: [birth year] (75 y.o. F) Treating RN: Dolan Amen Primary Care Rigel Filsinger: Otilio Miu Other Clinician: Referring Arlyce Circle: Otilio Miu Treating Abhijay Morriss/Extender: Jeri Cos Weeks in Treatment: 2 Vital Signs Height(in): 68 Pulse(bpm): 48 Weight(lbs): 135 Blood Pressure(mmHg): 118/58 Body Mass Index(BMI): 23 Temperature(F): 98.4 Respiratory Rate(breaths/min): 18 Photos: [N/A:N/A] Wound Location: Left, Medial Lower Leg Left, Anterior Lower Leg N/A Wounding Event: Bite Bite N/A Primary Etiology: Atypical Atypical N/A Comorbid History: Chronic Obstructive Pulmonary Chronic Obstructive Pulmonary N/A Disease (COPD) Disease (COPD) Date Acquired: 05/08/2021 05/08/2021 N/A Weeks of Treatment: 2 2 N/A Wound Status: Open Open N/A Measurements L x W x D (cm) 0.2x0.2x0.1 0.3x0.3x0.2 N/A Area (cm) : 0.031 0.071 N/A Volume (cm) : 0.003 0.014 N/A % Reduction in Area: 80.30% 74.90% N/A % Reduction in Volume: 90.30% 50.00% N/A Classification: Full Thickness Without Exposed Full Thickness  Without Exposed N/A Support Structures Support Structures Exudate Amount: Medium Medium N/A Exudate Type: Serosanguineous Sanguinous N/A Exudate Color: red, brown red N/A Granulation Amount: None Present (0%) Medium (34-66%) N/A Granulation Quality: N/A Red N/A Necrotic Amount: Large (67-100%) Medium (34-66%) N/A Exposed Structures: Fat Layer (Subcutaneous Tissue): Fat Layer (Subcutaneous Tissue): N/A  Yes Yes Fascia: No Fascia: No Tendon: No Tendon: No Muscle: No Muscle: No Joint: No Joint: No Bone: No Bone: No Epithelialization: None None N/A Treatment Notes Electronic Signature(s) Signed: 06/13/2021 4:55:48 PM By: Georges Mouse, Minus Breeding RN Entered By: Georges Mouse, Minus Breeding on 06/13/2021 10:02:14 Wrobel, Lorri Frederick ([id number]) -------------------------------------------------------------------------------- Multi-Disciplinary Care Plan Details Patient Name: Seabrooks, Ginamarie P. Date of Service: 06/13/2021 9:30 AM Medical Record Number: [id number] Patient Account Number: [id number] Date of Birth/Sex: 1946/11/14 (75 y.o. F) Treating RN: Dolan Amen Primary Care Lezly Rumpf: Otilio Miu Other Clinician: Referring Oluwadamilare Tobler: Otilio Miu Treating Zaliah Wissner/Extender: Jeri Cos Weeks in Treatment: 2 Active Inactive Wound/Skin Impairment Nursing Diagnoses: Impaired tissue integrity Goals: Patient/caregiver will verbalize understanding of skin care regimen Date Initiated: 05/30/2021 Date Inactivated: 06/06/2021 Target Resolution Date: 05/30/2021 Goal Status: Met Ulcer/skin breakdown will have a volume reduction of 30% by week 4 Date Initiated: 05/30/2021 Target Resolution Date: 06/29/2021 Goal Status: Active Ulcer/skin breakdown will have a volume reduction of 50% by week 8 Date Initiated: 05/30/2021 Target Resolution Date: 07/30/2021 Goal Status: Active Ulcer/skin breakdown will have a volume reduction of 80% by week 12 Date Initiated: 05/30/2021 Target Resolution Date:  08/30/2021 Goal Status: Active Ulcer/skin breakdown will heal within 14 weeks Date Initiated: 05/30/2021 Target Resolution Date: 09/29/2021 Goal Status: Active Interventions: Assess patient/caregiver ability to obtain necessary supplies Assess patient/caregiver ability to perform ulcer/skin care regimen upon admission and as needed Assess ulceration(s) every visit Provide education on ulcer and skin care Treatment Activities: Referred to DME Lakelynn Severtson for dressing supplies : 05/30/2021 Skin care regimen initiated : 05/30/2021 Notes: Electronic Signature(s) Signed: 06/13/2021 4:55:48 PM By: Georges Mouse, Minus Breeding RN Entered By: Georges Mouse, Minus Breeding on 06/13/2021 10:02:02 Egli, Lorri Frederick ([id number]) -------------------------------------------------------------------------------- Pain Assessment Details Patient Name: Chapel, Jase P. Date of Service: 06/13/2021 9:30 AM Medical Record Number: [id number] Patient Account Number: [id number] Date of Birth/Sex: 09/20/1946 (75 y.o. F) Treating RN: Carlene Coria Primary Care Matthias Bogus: Otilio Miu Other Clinician: Referring Novalee Horsfall: Otilio Miu Treating Treena Cosman/Extender: Jeri Cos Weeks in Treatment: 2 Active Problems Location of Pain Severity and Description of Pain Patient Has Paino No Site Locations Pain Management and Medication Current Pain Management: Electronic Signature(s) Signed: 06/15/2021 6:25:03 PM By: Carlene Coria RN Entered By: Carlene Coria on 06/13/2021 09:44:29 Rehberg, Fey PMarland Kitchen ([id number]) -------------------------------------------------------------------------------- Patient/Caregiver Education Details Patient Name: Ebrahim, Kanesha P. Date of Service: 06/13/2021 9:30 AM Medical Record Number: [id number] Patient Account Number: [id number] Date of Birth/Gender: 1946/09/17 (75 y.o. F) Treating RN: Dolan Amen Primary Care Physician: Otilio Miu Other Clinician: Referring Physician: Otilio Miu Treating  Physician/Extender: Skipper Cliche in Treatment: 2 Education Assessment Education Provided To: Patient Education Topics Provided Wound/Skin Impairment: Methods: Explain/Verbal Responses: State content correctly Electronic Signature(s) Signed: 06/13/2021 4:55:48 PM By: Georges Mouse, Minus Breeding RN Entered By: Georges Mouse, Minus Breeding on 06/13/2021 10:05:10 Buehler, Lorri Frederick ([id number]) -------------------------------------------------------------------------------- Wound Assessment Details Patient Name: Cotham, Josaphine P. Date of Service: 06/13/2021 9:30 AM Medical Record Number: [id number] Patient Account Number: [id number] Date of Birth/Sex: [birth year] (75 y.o. F) Treating RN: Carlene Coria Primary Care Arabelle Bollig: Otilio Miu Other Clinician: Referring Kenard Morawski: Otilio Miu Treating Wadell Craddock/Extender: Jeri Cos Weeks in Treatment: 2 Wound Status Wound Number: 1 Primary Etiology: Atypical Wound Location: Left, Medial Lower Leg Wound Status: Open Wounding Event: Bite Comorbid History: Chronic Obstructive Pulmonary Disease (COPD) Date Acquired: 05/08/2021 Weeks Of Treatment: 2 Clustered Wound: No Photos Wound Measurements Length: (cm) 0.2 Width: (cm) 0.2 Depth: (cm) 0.1 Area: (cm) 0.031 Volume: (cm) 0.003 % Reduction in Area: 80.3% % Reduction in  Volume: 90.3% Epithelialization: None Tunneling: No Undermining: No Wound Description Classification: Full Thickness Without Exposed Support Structu Exudate Amount: Medium Exudate Type: Serosanguineous Exudate Color: red, brown res Foul Odor After Cleansing: No Slough/Fibrino Yes Wound Bed Granulation Amount: None Present (0%) Exposed Structure Necrotic Amount: Large (67-100%) Fascia Exposed: No Necrotic Quality: Adherent Slough Fat Layer (Subcutaneous Tissue) Exposed: Yes Tendon Exposed: No Muscle Exposed: No Joint Exposed: No Bone Exposed: No Electronic Signature(s) Signed: 06/15/2021 6:25:03 PM By: Carlene Coria  RN Entered By: Carlene Coria on 06/13/2021 09:49:59 Morrone, Kabrea P. ([id number]) -------------------------------------------------------------------------------- Wound Assessment Details Patient Name: Feldt, Annalisia P. Date of Service: 06/13/2021 9:30 AM Medical Record Number: [id number] Patient Account Number: [id number] Date of Birth/Sex: 04-Dec-1946 (75 y.o. F) Treating RN: Carlene Coria Primary Care Gardner Servantes: Otilio Miu Other Clinician: Referring Damica Gravlin: Otilio Miu Treating Kendricks Reap/Extender: Jeri Cos Weeks in Treatment: 2 Wound Status Wound Number: 2 Primary Etiology: Atypical Wound Location: Left, Anterior Lower Leg Wound Status: Open Wounding Event: Bite Comorbid History: Chronic Obstructive Pulmonary Disease (COPD) Date Acquired: 05/08/2021 Weeks Of Treatment: 2 Clustered Wound: No Photos Wound Measurements Length: (cm) 0.3 Width: (cm) 0.3 Depth: (cm) 0.2 Area: (cm) 0.071 Volume: (cm) 0.014 % Reduction in Area: 74.9% % Reduction in Volume: 50% Epithelialization: None Tunneling: No Undermining: No Wound Description Classification: Full Thickness Without Exposed Support Structu Exudate Amount: Medium Exudate Type: Sanguinous Exudate Color: red res Foul Odor After Cleansing: No Slough/Fibrino Yes Wound Bed Granulation Amount: Medium (34-66%) Exposed Structure Granulation Quality: Red Fascia Exposed: No Necrotic Amount: Medium (34-66%) Fat Layer (Subcutaneous Tissue) Exposed: Yes Necrotic Quality: Adherent Slough Tendon Exposed: No Muscle Exposed: No Joint Exposed: No Bone Exposed: No Electronic Signature(s) Signed: 06/15/2021 6:25:03 PM By: Carlene Coria RN Entered By: Carlene Coria on 06/13/2021 09:50:20 Kresse, Kylene P. ([id number]) -------------------------------------------------------------------------------- Vitals Details Patient Name: Nicki Reaper, Lateasha P. Date of Service: 06/13/2021 9:30 AM Medical Record Number: [id number] Patient Account Number:  [id number] Date of Birth/Sex: 1945-12-22 (75 y.o. F) Treating RN: Carlene Coria Primary Care Nayelie Gionfriddo: Otilio Miu Other Clinician: Referring Makhari Dovidio: Otilio Miu Treating Kathalina Ostermann/Extender: Jeri Cos Weeks in Treatment: 2 Vital Signs Time Taken: 09:43 Temperature (F): 98.4 Height (in): 64 Pulse (bpm): 79 Weight (lbs): 135 Respiratory Rate (breaths/min): 18 Body Mass Index (BMI): 23.2 Blood Pressure (mmHg): 118/58 Reference Range: 80 - 120 mg / dl Electronic Signature(s) Signed: 06/15/2021 6:25:03 PM By: Carlene Coria RN Entered By: Carlene Coria on 06/13/2021 09:44:19

## 2021-06-27 ENCOUNTER — Encounter: Payer: Medicare Other | Attending: Physician Assistant | Admitting: Physician Assistant

## 2021-06-27 DIAGNOSIS — L97822 Non-pressure chronic ulcer of other part of left lower leg with fat layer exposed: Secondary | ICD-10-CM | POA: Insufficient documentation

## 2021-06-27 DIAGNOSIS — I83893 Varicose veins of bilateral lower extremities with other complications: Secondary | ICD-10-CM | POA: Diagnosis not present

## 2021-06-27 DIAGNOSIS — J439 Emphysema, unspecified: Secondary | ICD-10-CM | POA: Diagnosis not present

## 2021-06-27 DIAGNOSIS — I872 Venous insufficiency (chronic) (peripheral): Secondary | ICD-10-CM | POA: Insufficient documentation

## 2021-06-27 DIAGNOSIS — L97829 Non-pressure chronic ulcer of other part of left lower leg with unspecified severity: Secondary | ICD-10-CM | POA: Diagnosis present

## 2021-06-27 NOTE — Progress Notes (Signed)
[name], [name] ([id number]) Visit Report for 06/27/2021 Arrival Information Details Patient Name: [name], [name]. Date of Service: 06/27/2021 9:15 AM Medical Record Number: [id number] Patient Account Number: [id number] Date of Birth/Sex: 11/28/1946 (75 y.o. F) Treating [name]: [name] Primary Care [name]: [name] Other Clinician: Referring [name]: [name] Treating [name]/Extender: [name] in Treatment: 4 Visit Information History Since Last Visit Has Dressing in Place as Prescribed: Yes Patient Arrived: Ambulatory Pain Present Now: No Arrival Time: 09:05 Accompanied By: self Transfer Assistance: None Patient Identification Verified: Yes Secondary Verification Process Completed: Yes Patient Requires Transmission-Based Precautions: No Patient Has Alerts: Yes Patient Alerts: NOT diabetic Electronic Signature(s) Signed: 06/27/2021 4:18:09 PM By: [name] Entered By: [name] 09:05:42 [name], [name] ([id number]) -------------------------------------------------------------------------------- Clinic Level of Care Assessment Details Patient Name: [name], [name] Date of Service: 06/27/2021 9:15 AM Medical Record Number: [id number] Patient Account Number: [id number] Date of Birth/Sex: 05/20/1946 (75 y.o. F) Treating [name]: [name] Primary Care [name]: [name] Other Clinician: Referring [name]: [name] Treating [name]/Extender: [name] in Treatment: 4 Clinic Level of Care Assessment Items TOOL 4 Quantity Score X - Use when only an EandM is performed on FOLLOW-UP visit 1 0 ASSESSMENTS - Nursing Assessment / Reassessment X - Reassessment of Co-morbidities (includes updates in patient status) 1 10 X- 1 5 Reassessment of Adherence to Treatment Plan ASSESSMENTS - Wound and Skin Assessment / Reassessment X - Simple Wound Assessment / Reassessment - one wound 1 5 []  - 0 Complex Wound Assessment /  Reassessment - multiple wounds []  - 0 Dermatologic / Skin Assessment (not related to wound area) ASSESSMENTS - Focused Assessment []  - Circumferential Edema Measurements - multi extremities 0 []  - 0 Nutritional Assessment / Counseling / Intervention []  - 0 Lower Extremity Assessment (monofilament, tuning fork, pulses) []  - 0 Peripheral Arterial Disease Assessment (using hand held doppler) ASSESSMENTS - Ostomy and/or Continence Assessment and Care []  - Incontinence Assessment and Management 0 []  - 0 Ostomy Care Assessment and Management (repouching, etc.) PROCESS - Coordination of Care X - Simple Patient / Family Education for ongoing care 1 15 []  - 0 Complex (extensive) Patient / Family Education for ongoing care []  - 0 Staff obtains Programmer, systems, Records, Test Results / Process Orders []  - 0 Staff telephones HHA, Nursing Homes / Clarify orders / etc []  - 0 Routine Transfer to another Facility (non-emergent condition) []  - 0 Routine Hospital Admission (non-emergent condition) []  - 0 New Admissions / Biomedical engineer / Ordering NPWT, Apligraf, etc. []  - 0 Emergency Hospital Admission (emergent condition) X- 1 10 Simple Discharge Coordination []  - 0 Complex (extensive) Discharge Coordination PROCESS - Special Needs []  - Pediatric / Minor Patient Management 0 []  - 0 Isolation Patient Management []  - 0 Hearing / Language / Visual special needs []  - 0 Assessment of Community assistance (transportation, D/C planning, etc.) []  - 0 Additional assistance / Altered mentation []  - 0 Support Surface(s) Assessment (bed, cushion, seat, etc.) INTERVENTIONS - Wound Cleansing / Measurement Ells, [name] ([id number]) X- 1 5 Simple Wound Cleansing - one wound []  - 0 Complex Wound Cleansing - multiple wounds X- 1 5 Wound Imaging (photographs - any number of wounds) []  - 0 Wound Tracing (instead of photographs) X- 1 5 Simple Wound Measurement - one wound []  - 0 Complex  Wound Measurement - multiple wounds INTERVENTIONS - Wound Dressings []  - Small Wound Dressing one or multiple wounds 0 X- 1 15 Medium  Wound Dressing one or multiple wounds $RemoveBeforeD'[]'lKzbNShYFhfrhZ$  - 0 Large Wound Dressing one or multiple wounds $RemoveBeforeD'[]'RhKOOfwhyLfvYU$  - 0 Application of Medications - topical $RemoveB'[]'keKgJmYf$  - 0 Application of Medications - injection INTERVENTIONS - Miscellaneous $RemoveBeforeD'[]'lspdXIcwECLaaV$  - External ear exam 0 $Remo'[]'fdDet$  - 0 Specimen Collection (cultures, biopsies, blood, body fluids, etc.) $RemoveBefor'[]'iuAgACIFHxWK$  - 0 Specimen(s) / Culture(s) sent or taken to Lab for analysis $RemoveBefo'[]'koTRwORdgXu$  - 0 Patient Transfer (multiple staff / Harrel Lemon Lift / Similar devices) $RemoveBeforeDE'[]'zwNoDZnCRsYqOpX$  - 0 Simple Staple / Suture removal (25 or less) $Remove'[]'wumKQYl$  - 0 Complex Staple / Suture removal (26 or more) $Remove'[]'BwQVqYV$  - 0 Hypo / Hyperglycemic Management (close monitor of Blood Glucose) $RemoveBefore'[]'bwiaTPjFCOOmA$  - 0 Ankle / Brachial Index (ABI) - do not check if billed separately X- 1 5 Vital Signs Has the patient been seen at the hospital within the last three years: Yes Total Score: 80 Level Of Care: New/Established - Level 3 Electronic Signature(s) Signed: 06/27/2021 4:18:09 PM By: [name] Entered By: [name] 09:32:54 [name], Kaitlyn PMarland Kitchen ([id number]) -------------------------------------------------------------------------------- Encounter Discharge Information Details Patient Name: [name], [name] Date of Service: 06/27/2021 9:15 AM Medical Record Number: [id number] Patient Account Number: [id number] Date of Birth/Sex: 1946-03-03 (75 y.o. F) Treating [name]: [name] Primary Care Aryonna Gunnerson: [name] Other Clinician: Referring [name]: [name] Treating [name]/Extender: [name] in Treatment: 4 Encounter Discharge Information Items Discharge Condition: Stable Ambulatory Status: Ambulatory Discharge Destination: Home Transportation: Private Auto Accompanied By: self Schedule Follow-up Appointment: Yes Clinical Summary of Care: Electronic Signature(s) Signed: 06/27/2021 4:18:09  PM By: [name] Entered By: [name] 09:38:39 [name], [name] ([id number]) -------------------------------------------------------------------------------- Lower Extremity Assessment Details Patient Name: Mayall, Keyna P. Date of Service: 06/27/2021 9:15 AM Medical Record Number: [id number] Patient Account Number: [id number] Date of Birth/Sex: 1946-10-22 (75 y.o. F) Treating [name]: [name] Primary Care Raiza Kiesel: [name] Other Clinician: Referring Takira Sherrin: [name] Treating Damontre Millea/Extender: Jeri Cos Weeks in Treatment: 4 Edema Assessment Assessed: [Left: Yes] [Right: No] Edema: [Left: N] [Right: o] Vascular Assessment Pulses: Dorsalis Pedis Palpable: [Left:Yes] Electronic Signature(s) Signed: 06/27/2021 4:18:09 PM By: [name] Entered By: [name] 09:12:48 [name], [name] ([id number]) -------------------------------------------------------------------------------- Multi Wound Chart Details Patient Name: [name], [name] Date of Service: 06/27/2021 9:15 AM Medical Record Number: [id number] Patient Account Number: [id number] Date of Birth/Sex: 20-Jun-1946 (75 y.o. F) Treating [name]: [name] Primary Care Sibel Khurana: [name] Other Clinician: Referring Nila Winker: [name] Treating Calianna Kim/Extender: Jeri Cos Weeks in Treatment: 4 Vital Signs Height(in): 52 Pulse(bpm): 66 Weight(lbs): 135 Blood Pressure(mmHg): 124/75 Body Mass Index(BMI): 23 Temperature(F): 98.3 Respiratory Rate(breaths/min): 18 Photos: [N/A:N/A] Wound Location: Left, Medial Lower Leg Left, Anterior Lower Leg N/A Wounding Event: Bite Bite N/A Primary Etiology: Atypical Atypical N/A Comorbid History: Chronic Obstructive Pulmonary Chronic Obstructive Pulmonary N/A Disease (COPD) Disease (COPD) Date Acquired: 05/08/2021 05/08/2021 N/A Weeks of Treatment: 4 4 N/A Wound Status: Open Open N/A Measurements L x W x D (cm)  0.1x0.1x0.1 0.1x0.1x0.1 N/A Area (cm) : 0.008 0.008 N/A Volume (cm) : 0.001 0.001 N/A % Reduction in Area: 94.90% 97.20% N/A % Reduction in Volume: 96.80% 96.40% N/A Classification: Full Thickness Without Exposed Full Thickness Without Exposed N/A Support Structures Support Structures Exudate Amount: Medium Medium N/A Exudate Type: Serous Serous N/A Exudate Color: amber amber N/A Granulation Amount: Large (67-100%) Large (67-100%) N/A Granulation Quality: Red, Pink Red, Pink N/A Necrotic Amount: None Present (0%) None Present (0%) N/A Exposed Structures: Fat Layer (Subcutaneous  Tissue): Fat Layer (Subcutaneous Tissue): N/A Yes Yes Fascia: No Fascia: No Tendon: No Tendon: No Muscle: No Muscle: No Joint: No Joint: No Bone: No Bone: No Epithelialization: Small (1-33%) Medium (34-66%) N/A Treatment Notes Electronic Signature(s) Signed: 06/27/2021 4:18:09 PM By: [name] Entered By: [name] 09:31:10 [name], [name] ([id number]) -------------------------------------------------------------------------------- Multi-Disciplinary Care Plan Details Patient Name: Kaitlyn Floor. Date of Service: 06/27/2021 9:15 AM Medical Record Number: [id number] Patient Account Number: [id number] Date of Birth/Sex: 02/22/1946 (75 y.o. F) Treating [name]: [name] Primary Care Charlea Nardo: [name] Other Clinician: Referring Beckam Abdulaziz: [name] Treating Kalid Ghan/Extender: Jeri Cos Weeks in Treatment: 4 Active Inactive Wound/Skin Impairment Nursing Diagnoses: Impaired tissue integrity Goals: Patient/caregiver will verbalize understanding of skin care regimen Date Initiated: 05/30/2021 Date Inactivated: 06/06/2021 Target Resolution Date: 05/30/2021 Goal Status: Met Ulcer/skin breakdown will have a volume reduction of 30% by week 4 Date Initiated: 05/30/2021 Date Inactivated: 06/27/2021 Target Resolution Date: 06/29/2021 Goal Status: Met Ulcer/skin  breakdown will have a volume reduction of 50% by week 8 Date Initiated: 05/30/2021 Target Resolution Date: 07/30/2021 Goal Status: Active Ulcer/skin breakdown will have a volume reduction of 80% by week 12 Date Initiated: 05/30/2021 Target Resolution Date: 08/30/2021 Goal Status: Active Ulcer/skin breakdown will heal within 14 weeks Date Initiated: 05/30/2021 Target Resolution Date: 09/29/2021 Goal Status: Active Interventions: Assess patient/caregiver ability to obtain necessary supplies Assess patient/caregiver ability to perform ulcer/skin care regimen upon admission and as needed Assess ulceration(s) every visit Provide education on ulcer and skin care Treatment Activities: Referred to DME Anquan Azzarello for dressing supplies : 05/30/2021 Skin care regimen initiated : 05/30/2021 Notes: Electronic Signature(s) Signed: 06/27/2021 4:18:09 PM By: [name] Entered By: [name] 09:31:00 [name], [name] ([id number]) -------------------------------------------------------------------------------- Pain Assessment Details Patient Name: [name], [name] Date of Service: 06/27/2021 9:15 AM Medical Record Number: [id number] Patient Account Number: [id number] Date of Birth/Sex: 1946/11/11 (75 y.o. F) Treating [name]: [name] Primary Care Chontel Warning: [name] Other Clinician: Referring Quynn Vilchis: [name] Treating Ayat Drenning/Extender: [name] in Treatment: 4 Active Problems Location of Pain Severity and Description of Pain Patient Has Paino No Site Locations Rate the pain. Current Pain Level: 0 Pain Management and Medication Current Pain Management: Electronic Signature(s) Signed: 06/27/2021 4:18:09 PM By: [name] Entered By: [name] 09:06:07 Kinner, Lorri Frederick ([id number]) -------------------------------------------------------------------------------- Patient/Caregiver Education Details Patient Name: [name], Jameia  P. Date of Service: 06/27/2021 9:15 AM Medical Record Number: [id number] Patient Account Number: [id number] Date of Birth/Gender: 1946/05/08 (75 y.o. F) Treating [name]: [name] Primary Care Physician: [name] Other Clinician: Referring Physician: Otilio [name] Treating Physician/Extender: [name] in Treatment: 4 Education Assessment Education Provided To: Patient Education Topics Provided Wound/Skin Impairment: Methods: Explain/Verbal Responses: State content correctly Electronic Signature(s) Signed: 06/27/2021 4:18:09 PM By: [name] Entered By: [name] 09:33:06 Leeds, Chellsea P. ([id number]) -------------------------------------------------------------------------------- Wound Assessment Details Patient Name: Zaragosa, Cecilee P. Date of Service: 06/27/2021 9:15 AM Medical Record Number: [id number] Patient Account Number: [id number] Date of Birth/Sex: 10/13/1946 (75 y.o. F) Treating [name]: [name] Primary Care Ceasia Elwell: [name] Other Clinician: Referring Lynsi Dooner: [name] Treating Ayme Short/Extender: Jeri Cos Weeks in Treatment: 4 Wound Status Wound Number: 1 Primary Etiology: Atypical Wound Location: Left, Medial Lower Leg Wound Status: Open Wounding Event: Bite Comorbid History: Chronic Obstructive Pulmonary Disease (COPD) Date Acquired: 05/08/2021 Weeks Of Treatment: 4 Clustered Wound: No Photos Wound Measurements Length: (cm) 0.1  Width: (cm) 0.1 Depth: (cm) 0.1 Area: (cm) 0.008 Volume: (cm) 0.001 % Reduction in Area: 94.9% % Reduction in Volume: 96.8% Epithelialization: Small (1-33%) Tunneling: No Undermining: No Wound Description Classification: Full Thickness Without Exposed Support Structu Exudate Amount: Medium Exudate Type: Serous Exudate Color: amber res Foul Odor After Cleansing: No Slough/Fibrino No Wound Bed Granulation Amount: Large (67-100%) Exposed Structure Granulation Quality:  Red, Pink Fascia Exposed: No Necrotic Amount: None Present (0%) Fat Layer (Subcutaneous Tissue) Exposed: Yes Tendon Exposed: No Muscle Exposed: No Joint Exposed: No Bone Exposed: No Treatment Notes Wound #1 (Lower Leg) Wound Laterality: Left, Medial Cleanser Soap and Water Discharge Instruction: Gently cleanse wound with antibacterial soap, rinse and pat dry prior to dressing wounds Peri-Wound Care JURY, CASERTA. ([id number]) Topical Primary Dressing Prisma 4.34 (in) Discharge Instruction: Moisten w/normal saline or sterile water; Cover wound as directed. Do not remove from wound bed. Secondary Dressing Mepilex Border Flex, 4x4 (in/in) Discharge Instruction: Apply to wound as directed. Do not cut. Secured With Tubigrip Size D, 3x10 (in/yd) Discharge Instruction: Apply over dressings Compression Wrap Compression Stockings Add-Ons Electronic Signature(s) Signed: 06/27/2021 4:18:09 PM By: [name] Entered By: [name] 09:11:08 Brim, Magdeline P. ([id number]) -------------------------------------------------------------------------------- Wound Assessment Details Patient Name: Easler, Bethene P. Date of Service: 06/27/2021 9:15 AM Medical Record Number: [id number] Patient Account Number: [id number] Date of Birth/Sex: [birth year] (75 y.o. F) Treating [name]: [name] Primary Care Juanitta Earnhardt: [name] Other Clinician: Referring Floye Fesler: [name] Treating Alisen Marsiglia/Extender: Jeri Cos Weeks in Treatment: 4 Wound Status Wound Number: 2 Primary Etiology: Atypical Wound Location: Left, Anterior Lower Leg Wound Status: Healed - Epithelialized Wounding Event: Bite Comorbid History: Chronic Obstructive Pulmonary Disease (COPD) Date Acquired: 05/08/2021 Weeks Of Treatment: 4 Clustered Wound: No Photos Wound Measurements Length: (cm) 0 Width: (cm) 0 Depth: (cm) 0 Area: (cm) 0 Volume: (cm) 0 % Reduction in Area: 100% % Reduction in Volume:  100% Epithelialization: Large (67-100%) Tunneling: No Undermining: No Wound Description Classification: Full Thickness Without Exposed Support Structure Exudate Amount: None Present s Foul Odor After Cleansing: No Slough/Fibrino No Wound Bed Granulation Amount: None Present (0%) Exposed Structure Necrotic Amount: None Present (0%) Fascia Exposed: No Fat Layer (Subcutaneous Tissue) Exposed: No Tendon Exposed: No Muscle Exposed: No Joint Exposed: No Bone Exposed: No Electronic Signature(s) Signed: 06/27/2021 4:18:09 PM By: [name] Entered By: [name] 09:32:04 Bussa, Shelise P. ([id number]) -------------------------------------------------------------------------------- Vitals Details Patient Name: Talley, Reyanna P. Date of Service: 06/27/2021 9:15 AM Medical Record Number: [id number] Patient Account Number: [id number] Date of Birth/Sex: April 06, 1946 (75 y.o. F) Treating [name]: [name] Primary Care Kaylea Mounsey: [name] Other Clinician: Referring Keyan Folson: [name] Treating Jalie Eiland/Extender: Jeri Cos Weeks in Treatment: 4 Vital Signs Time Taken: 09:05 Temperature (F): 98.3 Height (in): 64 Pulse (bpm): 82 Weight (lbs): 135 Respiratory Rate (breaths/min): 18 Body Mass Index (BMI): 23.2 Blood Pressure (mmHg): 124/75 Reference Range: 80 - 120 mg / dl Electronic Signature(s) Signed: 06/27/2021 4:18:09 PM By: [name] Entered By: [name] 09:05:57

## 2021-06-27 NOTE — Progress Notes (Addendum)
[name], [name] ([id number]) Visit Report for 06/27/2021 Chief Complaint Document Details Patient Name: [name], [name]. Date of Service: 06/27/2021 9:15 AM Medical Record Number: [id number] Patient Account Number: [id number] Date of Birth/Sex: 1946-08-12 (75 y.o. F) Treating RN: Rogers Blocker Primary Care Provider: Elizabeth Sauer Other Clinician: Referring Provider: Elizabeth Sauer Treating Provider/Extender: Rowan Blase in Treatment: 4 Information Obtained from: Patient Chief Complaint Left LE Ulcers following tick bites Electronic Signature(s) Signed: 06/27/2021 9:29:41 AM By: Lenda Kelp PA-C Entered By: Lenda Kelp on 06/27/2021 09:29:41 Clingenpeel, Sommer P. ([id number]) -------------------------------------------------------------------------------- HPI Details Patient Name: [name], [name] Date of Service: 06/27/2021 9:15 AM Medical Record Number: [id number] Patient Account Number: [id number] Date of Birth/Sex: 09/11/1946 (75 y.o. F) Treating RN: Rogers Blocker Primary Care Provider: Elizabeth Sauer Other Clinician: Referring Provider: Elizabeth Sauer Treating Provider/Extender: Rowan Blase in Treatment: 4 History of Present Illness HPI Description: 05/30/2021 upon evaluation today patient presents for initial inspection here in our clinic concerning issues that she has been having with 2 wounds currently which are on her left lower leg. She notes that one of them was a definitive tick that she removed and then had to reaction from that the second is a spot that looks identical but she did not actually identify a specific tick she had a scab that she pulled off but never found a tick within the scab that she could tell anyway. Fortunately there does not appear to be any signs of active infection systemically at this time which is great news. No fevers, chills, nausea, vomiting, or diarrhea. With that being said unfortunately she is having a lot of irritation including itching  around the edges of the wound and even pain as well in regard to the anterior wound. 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 She is not extremely swollen however at this point. She also does have emphysema but otherwise no major medical problems that would affect her wound healing. She is concerned about the tick bites as her husband apparently has had some issues with tick fevers in the past. She does not want this to turn into anything more significant. The patient currently has been using salt water to clean the area with along with Polysporin and Neosporin and peroxide as well. She tells me that this initially occurred on May 08, 2021. She has not really been keeping it covered it is somewhat scabbed to some degree today. 06/06/2021 upon evaluation today patient presents for reevaluation today regarding her wounds on her legs. Both are doing better they seem to be less inflamed and overall she seems to be greatly improved. She tells me this is not hurting or itching nearly as much as it was last time I saw her. 06/13/2021 upon evaluation today patient appears to be doing well. She is making progress. With that being said I do not think she is going to require the steroid ointment any longer. With that being said I do think that she would benefit from continuing the collagen I think that is can be the biggest thing try to get some granulation tissue filling in and helping to heal these areas. 06/27/2021 upon evaluation today patient appears to be doing excellent in regard to her leg ulcers one of them is healed the other is significantly improved. She tells me that she is feeling much better and very pleased with where things stand in general. Honestly I think she is very close to complete  resolution in total. Electronic Signature(s) Signed: 06/27/2021 6:16:06 PM By: Lenda Kelp PA-C Entered By: Lenda Kelp on 06/27/2021 18:16:06 [name],  [name] ([id number]) -------------------------------------------------------------------------------- Physical Exam Details Patient Name: [name], [name] Date of Service: 06/27/2021 9:15 AM Medical Record Number: [id number] Patient Account Number: [id number] Date of Birth/Sex: 10/26/1946 (75 y.o. F) Treating RN: Rogers Blocker Primary Care Provider: Elizabeth Sauer Other Clinician: Referring Provider: Elizabeth Sauer Treating Provider/Extender: Allen Derry Weeks in Treatment: 4 Constitutional Well-nourished and well-hydrated in no acute distress. Respiratory normal breathing without difficulty. Psychiatric this patient is able to make decisions and demonstrates good insight into disease process. Alert and Oriented x 3. pleasant and cooperative. Notes Patient's wound bed showed signs of good granulation epithelization at this point. There does not appear to be any signs of infection which is great news and overall I am extremely pleased with where things stand today. No fevers, chills, nausea, vomiting, or diarrhea. Electronic Signature(s) Signed: 06/27/2021 6:16:20 PM By: Lenda Kelp PA-C Entered By: Lenda Kelp on 06/27/2021 18:16:20 Worthley, Lilli Light ([id number]) -------------------------------------------------------------------------------- Physician Orders Details Patient Name: [name], [name] Date of Service: 06/27/2021 9:15 AM Medical Record Number: [id number] Patient Account Number: [id number] Date of Birth/Sex: June 22, 1946 (75 y.o. F) Treating RN: Rogers Blocker Primary Care Provider: Elizabeth Sauer Other Clinician: Referring Provider: Elizabeth Sauer Treating Provider/Extender: Rowan Blase in Treatment: 4 Verbal / Phone Orders: No Diagnosis Coding ICD-10 Coding Code Description [phone] Varicose veins of bilateral lower extremities with other complications I87.2 Venous insufficiency (chronic) (peripheral) L97.822 Non-pressure chronic ulcer of other part of left lower  leg with fat layer exposed J43.9 Emphysema, unspecified Follow-up Appointments o Return Appointment in 2 weeks. Bathing/ Shower/ Hygiene o May shower; gently cleanse wound with antibacterial soap, rinse and pat dry prior to dressing wounds Wound Treatment Wound #1 - Lower Leg Wound Laterality: Left, Medial Cleanser: Soap and Water 3 x Per Week/30 Days Discharge Instructions: Gently cleanse wound with antibacterial soap, rinse and pat dry prior to dressing wounds Primary Dressing: Prisma 4.34 (in) (Generic) 3 x Per Week/30 Days Discharge Instructions: Moisten w/normal saline or sterile water; Cover wound as directed. Do not remove from wound bed. Secondary Dressing: Mepilex Border Flex, 4x4 (in/in) (Generic) 3 x Per Week/30 Days Discharge Instructions: Apply to wound as directed. Do not cut. Secured With: Tubigrip Size D, 3x10 (in/yd) 3 x Per Week/30 Days Discharge Instructions: Apply over dressings Electronic Signature(s) Signed: 06/27/2021 4:18:09 PM By: Rogers Blocker RN Signed: 06/27/2021 6:20:26 PM By: Lenda Kelp PA-C Entered By: Rogers Blocker on 06/27/2021 09:32:31 Glas, Manasi P. ([id number]) -------------------------------------------------------------------------------- Problem List Details Patient Name: [name], [name] Date of Service: 06/27/2021 9:15 AM Medical Record Number: [id number] Patient Account Number: [id number] Date of Birth/Sex: 1946/08/22 (75 y.o. F) Treating RN: Rogers Blocker Primary Care Provider: Elizabeth Sauer Other Clinician: Referring Provider: Elizabeth Sauer Treating Provider/Extender: Allen Derry Weeks in Treatment: 4 Active Problems ICD-10 Encounter Code Description Active Date MDM Diagnosis [phone] Varicose veins of bilateral lower extremities with other complications 05/30/2021 No Yes I87.2 Venous insufficiency (chronic) (peripheral) 05/30/2021 No Yes L97.822 Non-pressure chronic ulcer of other part of left lower leg with fat layer 05/30/2021  No Yes exposed J43.9 Emphysema, unspecified 05/30/2021 No Yes Inactive Problems Resolved Problems Electronic Signature(s) Signed: 06/27/2021 9:29:36 AM By: Lenda Kelp PA-C Entered By: Lenda Kelp on 06/27/2021 09:29:35 Heckman, Ambera P. ([id number]) -------------------------------------------------------------------------------- Progress Note Details Patient Name: [name], [name] Date of Service: 06/27/2021 9:15 AM Medical Record  Number: [id number] Patient Account Number: [id number] Date of Birth/Sex: 01-26-1946 (75 y.o. F) Treating RN: Rogers Blocker Primary Care Provider: Elizabeth Sauer Other Clinician: Referring Provider: Elizabeth Sauer Treating Provider/Extender: Rowan Blase in Treatment: 4 Subjective Chief Complaint Information obtained from Patient Left LE Ulcers following tick bites History of Present Illness (HPI) 05/30/2021 upon evaluation today patient presents for initial inspection here in our clinic concerning issues that she has been having with 2 wounds currently which are on her left lower leg. She notes that one of them was a definitive tick that she removed and then had to reaction from that the second is a spot that looks identical but she did not actually identify a specific tick she had a scab that she pulled off but never found a tick within the scab that she could tell anyway. Fortunately there does not appear to be any signs of active infection systemically at this time which is great news. No fevers, chills, nausea, vomiting, or diarrhea. With that being said unfortunately she is having a lot of irritation including itching around the edges of the wound and even pain as well in regard to the anterior wound. 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 She is not extremely swollen however at this point. She also does have emphysema but otherwise no major medical problems that would affect her wound healing. She  is concerned about the tick bites as her husband apparently has had some issues with tick fevers in the past. She does not want this to turn into anything more significant. The patient currently has been using salt water to clean the area with along with Polysporin and Neosporin and peroxide as well. She tells me that this initially occurred on May 08, 2021. She has not really been keeping it covered it is somewhat scabbed to some degree today. 06/06/2021 upon evaluation today patient presents for reevaluation today regarding her wounds on her legs. Both are doing better they seem to be less inflamed and overall she seems to be greatly improved. She tells me this is not hurting or itching nearly as much as it was last time I saw her. 06/13/2021 upon evaluation today patient appears to be doing well. She is making progress. With that being said I do not think she is going to require the steroid ointment any longer. With that being said I do think that she would benefit from continuing the collagen I think that is can be the biggest thing try to get some granulation tissue filling in and helping to heal these areas. 06/27/2021 upon evaluation today patient appears to be doing excellent in regard to her leg ulcers one of them is healed the other is significantly improved. She tells me that she is feeling much better and very pleased with where things stand in general. Honestly I think she is very close to complete resolution in total. Objective Constitutional Well-nourished and well-hydrated in no acute distress. Vitals Time Taken: 9:05 AM, Height: 64 in, Weight: 135 lbs, BMI: 23.2, Temperature: 98.3 F, Pulse: 82 bpm, Respiratory Rate: 18 breaths/min, Blood Pressure: 124/75 mmHg. Respiratory normal breathing without difficulty. Psychiatric this patient is able to make decisions and demonstrates good insight into disease process. Alert and Oriented x 3. pleasant and cooperative. General Notes:  Patient's wound bed showed signs of good granulation epithelization at this point. There does not appear to be any signs of infection which is great news and overall I am  extremely pleased with where things stand today. No fevers, chills, nausea, vomiting, or diarrhea. Integumentary (Hair, Skin) Wound #1 status is Open. Original cause of wound was Bite. The date acquired was: 05/08/2021. The wound has been in treatment 4 weeks. The wound is located on the Left,Medial Lower Leg. The wound measures 0.1cm length x 0.1cm width x 0.1cm depth; 0.008cm^2 area and 0.001cm^3 volume. There is Fat Layer (Subcutaneous Tissue) exposed. There is no tunneling or undermining noted. There is a medium amount of serous drainage noted. There is large (67-100%) red, pink granulation within the wound bed. There is no necrotic tissue within the wound bed. [name], [name] ([id number]) Wound #2 status is Healed - Epithelialized. Original cause of wound was Bite. The date acquired was: 05/08/2021. The wound has been in treatment 4 weeks. The wound is located on the Left,Anterior Lower Leg. The wound measures 0cm length x 0cm width x 0cm depth; 0cm^2 area and 0cm^3 volume. There is no tunneling or undermining noted. There is a none present amount of drainage noted. There is no granulation within the wound bed. There is no necrotic tissue within the wound bed. Assessment Active Problems ICD-10 Varicose veins of bilateral lower extremities with other complications Venous insufficiency (chronic) (peripheral) Non-pressure chronic ulcer of other part of left lower leg with fat layer exposed Emphysema, unspecified Plan Follow-up Appointments: Return Appointment in 2 weeks. Bathing/ Shower/ Hygiene: May shower; gently cleanse wound with antibacterial soap, rinse and pat dry prior to dressing wounds WOUND #1: - Lower Leg Wound Laterality: Left, Medial Cleanser: Soap and Water 3 x Per Week/30 Days Discharge Instructions: Gently  cleanse wound with antibacterial soap, rinse and pat dry prior to dressing wounds Primary Dressing: Prisma 4.34 (in) (Generic) 3 x Per Week/30 Days Discharge Instructions: Moisten w/normal saline or sterile water; Cover wound as directed. Do not remove from wound bed. Secondary Dressing: Mepilex Border Flex, 4x4 (in/in) (Generic) 3 x Per Week/30 Days Discharge Instructions: Apply to wound as directed. Do not cut. Secured With: Tubigrip Size D, 3x10 (in/yd) 3 x Per Week/30 Days Discharge Instructions: Apply over dressings 1. Would recommend that we going continue with the wound care measures as before and the patient is in agreement the plan this includes the use of the silver collagen dressing which I think is doing a great job. 2. We will continue with the border foam dressing to cover. 3. We will also continue with the Tubigrip. We will see patient back for reevaluation in 2 weeks here in the clinic. If anything worsens or changes patient will contact our office for additional recommendations. This is per patient request. Electronic Signature(s) Signed: 06/27/2021 6:16:46 PM By: Lenda KelpStone III, Mlissa Tamayo PA-C Entered By: Lenda KelpStone III, Ronte Parker on 06/27/2021 18:16:46 [name], Kaitlyn PMarland Kitchen. ([id number]) -------------------------------------------------------------------------------- SuperBill Details Patient Name: [name], [name] Date of Service: 06/27/2021 Medical Record Number: [id number] Patient Account Number: [id number] Date of Birth/Sex: [birth year] (75 y.o. F) Treating RN: Rogers BlockerSanchez, Kenia Primary Care Provider: Elizabeth SauerJones, Deanna Other Clinician: Referring Provider: Elizabeth SauerJones, Deanna Treating Provider/Extender: Allen DerryStone, Jin Capote Weeks in Treatment: 4 Diagnosis Coding ICD-10 Codes Code Description [phone] Varicose veins of bilateral lower extremities with other complications I87.2 Venous insufficiency (chronic) (peripheral) L97.822 Non-pressure chronic ulcer of other part of left lower leg with fat layer exposed J43.9  Emphysema, unspecified Facility Procedures CPT4 Code: [id number] Description: 99213 - WOUND CARE VISIT-LEV 3 EST PT Modifier: Quantity: 1 Physician Procedures CPT4 Code: [id number] Description: 99213 - WC PHYS LEVEL 3 - EST PT Modifier: Quantity: 1 CPT4  Code: Description: ICD-10 Diagnosis Description I83.893 Varicose veins of bilateral lower extremities with other complications I87.2 Venous insufficiency (chronic) (peripheral) L97.822 Non-pressure chronic ulcer of other part of left lower leg with fat lay  J43.9 Emphysema, unspecified Modifier: er exposed Quantity: Electronic Signature(s) Signed: 06/27/2021 6:17:03 PM By: Lenda Kelp PA-C Previous Signature: 06/27/2021 4:18:09 PM Version By: Rogers Blocker RN Entered By: Lenda Kelp on 06/27/2021 18:17:02

## 2021-07-11 ENCOUNTER — Other Ambulatory Visit: Payer: Self-pay

## 2021-07-11 ENCOUNTER — Encounter: Payer: Medicare Other | Admitting: Physician Assistant

## 2021-07-11 DIAGNOSIS — L97822 Non-pressure chronic ulcer of other part of left lower leg with fat layer exposed: Secondary | ICD-10-CM | POA: Diagnosis not present

## 2021-07-11 NOTE — Progress Notes (Addendum)
[name] ([id number]) Visit Report for 07/11/2021 Chief Complaint Document Details Patient Name: [name], [name]. Date of Service: 07/11/2021 9:15 AM Medical Record Number: [id number] Patient Account Number: [id number] Date of Birth/Sex: [birth year] (75 y.o. F) Treating RN: Rogers Blocker Primary Care Provider: Elizabeth Sauer Other Clinician: Referring Provider: Elizabeth Sauer Treating Provider/Extender: Rowan Blase in Treatment: 6 Information Obtained from: Patient Chief Complaint Left LE Ulcers following tick bites Electronic Signature(s) Signed: 07/11/2021 9:21:07 AM By: Lenda Kelp PA-C Entered By: Lenda Kelp on 07/11/2021 09:21:07 Fritts, Maleny P. ([id number]) -------------------------------------------------------------------------------- HPI Details Patient Name: Frate, [name] Date of Service: 07/11/2021 9:15 AM Medical Record Number: [id number] Patient Account Number: [id number] Date of Birth/Sex: 1946-11-17 (75 y.o. F) Treating RN: Rogers Blocker Primary Care Provider: Elizabeth Sauer Other Clinician: Referring Provider: Elizabeth Sauer Treating Provider/Extender: Rowan Blase in Treatment: 6 History of Present Illness HPI Description: 05/30/2021 upon evaluation today patient presents for initial inspection here in our clinic concerning issues that she has been having with 2 wounds currently which are on her left lower leg. She notes that one of them was a definitive tick that she removed and then had to reaction from that the second is a spot that looks identical but she did not actually identify a specific tick she had a scab that she pulled off but never found a tick within the scab that she could tell anyway. Fortunately there does not appear to be any signs of active infection systemically at this time which is great news. No fevers, chills, nausea, vomiting, or diarrhea. With that being said unfortunately she is having a lot of irritation including itching  around the edges of the wound and even pain as well in regard to the anterior wound. 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 She is not extremely swollen however at this point. She also does have emphysema but otherwise no major medical problems that would affect her wound healing. She is concerned about the tick bites as her husband apparently has had some issues with tick fevers in the past. She does not want this to turn into anything more significant. The patient currently has been using salt water to clean the area with along with Polysporin and Neosporin and peroxide as well. She tells me that this initially occurred on May 08, 2021. She has not really been keeping it covered it is somewhat scabbed to some degree today. 06/06/2021 upon evaluation today patient presents for reevaluation today regarding her wounds on her legs. Both are doing better they seem to be less inflamed and overall she seems to be greatly improved. She tells me this is not hurting or itching nearly as much as it was last time I saw her. 06/13/2021 upon evaluation today patient appears to be doing well. She is making progress. With that being said I do not think she is going to require the steroid ointment any longer. With that being said I do think that she would benefit from continuing the collagen I think that is can be the biggest thing try to get some granulation tissue filling in and helping to heal these areas. 06/27/2021 upon evaluation today patient appears to be doing excellent in regard to her leg ulcers one of them is healed the other is significantly improved. She tells me that she is feeling much better and very pleased with where things stand in general. Honestly I think she is very close to complete  resolution in total. 07/11/2021 upon evaluation today patient appears to be doing excellent in regard to her wounds. In fact this appears to be completely  healed currently. She is extremely pleased with this as to be honest this has taken a bit longer than what she was hoping for. Nonetheless I am happy as well that she is healed and doing so well. Electronic Signature(s) Signed: 07/11/2021 6:02:51 PM By: Lenda KelpStone III, Shoshannah Faubert PA-C Entered By: Lenda KelpStone III, Glynnis Gavel on 07/11/2021 18:02:51 Richins, Kathe Demetrius CharityP. ([id number]) -------------------------------------------------------------------------------- Physical Exam Details Patient Name: Pharris, [name] Date of Service: 07/11/2021 9:15 AM Medical Record Number: [id number] Patient Account Number: [id number] Date of Birth/Sex: 26-Dec-1945 78(75 y.o. F) Treating RN: Rogers BlockerSanchez, Kenia Primary Care Provider: Elizabeth SauerJones, Deanna Other Clinician: Referring Provider: Elizabeth SauerJones, Deanna Treating Provider/Extender: Allen DerryStone, Elease Swarm Weeks in Treatment: 6 Constitutional Well-nourished and well-hydrated in no acute distress. Respiratory normal breathing without difficulty. Psychiatric this patient is able to make decisions and demonstrates good insight into disease process. Alert and Oriented x 3. pleasant and cooperative. Notes Upon inspection patient's wound bed actually showed signs of being completely healed and overall I am extremely pleased in that regard there does not appear to be any signs of infection which is great news. Electronic Signature(s) Signed: 07/11/2021 6:03:03 PM By: Lenda KelpStone III, Zaliyah Meikle PA-C Entered By: Lenda KelpStone III, Asyah Candler on 07/11/2021 18:03:03 Fredman, Jacilyn Demetrius CharityP. ([id number]) -------------------------------------------------------------------------------- Physician Orders Details Patient Name: Biernat, [name] Date of Service: 07/11/2021 9:15 AM Medical Record Number: [id number] Patient Account Number: [id number] Date of Birth/Sex: 26-Dec-1945 [age] y.o. F) Treating RN: Rogers BlockerSanchez, Kenia Primary Care Provider: Elizabeth SauerJones, Deanna Other Clinician: Referring Provider: Elizabeth SauerJones, Deanna Treating Provider/Extender: Rowan BlaseStone, Aseneth Hack Weeks in Treatment:  6 Verbal / Phone Orders: No Diagnosis Coding ICD-10 Coding Code Description (623) [phone] Varicose veins of bilateral lower extremities with other complications I87.2 Venous insufficiency (chronic) (peripheral) L97.822 Non-pressure chronic ulcer of other part of left lower leg with fat layer exposed J43.9 Emphysema, unspecified [facility] Treatment Complete Electronic Signature(s) Signed: 07/11/2021 3:39:23 PM By: Rogers BlockerSanchez , Kenia RN Signed: 07/12/2021 5:04:06 PM By: Lenda KelpStone III, Chiniqua Kilcrease PA-C Entered By: Rogers BlockerSanchez , Kenia on 07/11/2021 09:47:11 Rivenbark, Dicie P. ([id number]) -------------------------------------------------------------------------------- Problem List Details Patient Name: Schwier, Magdalena P. Date of Service: 07/11/2021 9:15 AM Medical Record Number: [id number] Patient Account Number: [id number] Date of Birth/Sex: 26-Dec-1945 [age] y.o. F) Treating RN: Rogers BlockerSanchez, Kenia Primary Care Provider: Elizabeth SauerJones, Deanna Other Clinician: Referring Provider: Elizabeth SauerJones, Deanna Treating Provider/Extender: Allen DerryStone, Alister Staver Weeks in Treatment: 6 Active Problems ICD-10 Encounter Code Description Active Date MDM Diagnosis I83.893 Varicose veins of bilateral lower extremities with other complications 05/30/2021 No Yes I87.2 Venous insufficiency (chronic) (peripheral) 05/30/2021 No Yes L97.822 Non-pressure chronic ulcer of other part of left lower leg with fat layer 05/30/2021 No Yes exposed J43.9 Emphysema, unspecified 05/30/2021 No Yes Inactive Problems Resolved Problems Electronic Signature(s) Signed: 07/11/2021 9:21:01 AM By: Lenda KelpStone III, Jashan Cotten PA-C Entered By: Lenda KelpStone III, Lisandra Mathisen on 07/11/2021 09:21:01 Shawhan, Loree P. ([id number]) -------------------------------------------------------------------------------- Progress Note Details Patient Name: Fickle, Stesha P. Date of Service: 07/11/2021 9:15 AM Medical Record Number: [id number] Patient Account Number: [id number] Date of  Birth/Sex: 26-Dec-1945 [age] y.o. F) Treating RN: Rogers BlockerSanchez, Kenia Primary Care Provider: Elizabeth SauerJones, Deanna Other Clinician: Referring Provider: Elizabeth SauerJones, Deanna Treating Provider/Extender: Rowan BlaseStone, Jovani Flury Weeks in Treatment: 6 Subjective Chief Complaint Information obtained from Patient Left LE Ulcers following tick bites History of Present Illness (HPI) 05/30/2021 upon evaluation today patient presents for initial inspection here in our clinic concerning issues that  she has been having with 2 wounds currently which are on her left lower leg. She notes that one of them was a definitive tick that she removed and then had to reaction from that the second is a spot that looks identical but she did not actually identify a specific tick she had a scab that she pulled off but never found a tick within the scab that she could tell anyway. Fortunately there does not appear to be any signs of active infection systemically at this time which is great news. No fevers, chills, nausea, vomiting, or diarrhea. With that being said unfortunately she is having a lot of irritation including itching around the edges of the wound and even pain as well in regard to the anterior wound. 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 She is not extremely swollen however at this point. She also does have emphysema but otherwise no major medical problems that would affect her wound healing. She is concerned about the tick bites as her husband apparently has had some issues with tick fevers in the past. She does not want this to turn into anything more significant. The patient currently has been using salt water to clean the area with along with Polysporin and Neosporin and peroxide as well. She tells me that this initially occurred on May 08, 2021. She has not really been keeping it covered it is somewhat scabbed to some degree today. 06/06/2021 upon evaluation today patient presents for  reevaluation today regarding her wounds on her legs. Both are doing better they seem to be less inflamed and overall she seems to be greatly improved. She tells me this is not hurting or itching nearly as much as it was last time I saw her. 06/13/2021 upon evaluation today patient appears to be doing well. She is making progress. With that being said I do not think she is going to require the steroid ointment any longer. With that being said I do think that she would benefit from continuing the collagen I think that is can be the biggest thing try to get some granulation tissue filling in and helping to heal these areas. 06/27/2021 upon evaluation today patient appears to be doing excellent in regard to her leg ulcers one of them is healed the other is significantly improved. She tells me that she is feeling much better and very pleased with where things stand in general. Honestly I think she is very close to complete resolution in total. 07/11/2021 upon evaluation today patient appears to be doing excellent in regard to her wounds. In fact this appears to be completely healed currently. She is extremely pleased with this as to be honest this has taken a bit longer than what she was hoping for. Nonetheless I am happy as well that she is healed and doing so well. Objective Constitutional Well-nourished and well-hydrated in no acute distress. Vitals Time Taken: 9:25 AM, Height: 64 in, Weight: 135 lbs, BMI: 23.2, Temperature: 98.6 F, Pulse: 73 bpm, Respiratory Rate: 18 breaths/min, Blood Pressure: 108/62 mmHg. Respiratory normal breathing without difficulty. Psychiatric this patient is able to make decisions and demonstrates good insight into disease process. Alert and Oriented x 3. pleasant and cooperative. General Notes: Upon inspection patient's wound bed actually showed signs of being completely healed and overall I am extremely pleased in that regard there does not appear to be any signs of  infection which is great news. Integumentary (Hair, Skin) Wound #1  status is Healed - Epithelialized. Original cause of wound was Bite. The date acquired was: 05/08/2021. The wound has been in [place], Alonzo P. ([id number]) treatment 6 weeks. The wound is located on the Left,Medial Lower Leg. The wound measures 0cm length x 0cm width x 0cm depth; 0cm^2 area and 0cm^3 volume. There is no tunneling or undermining noted. There is a none present amount of drainage noted. There is no granulation within the wound bed. There is no necrotic tissue within the wound bed. Assessment Active Problems ICD-10 Varicose veins of bilateral lower extremities with other complications Venous insufficiency (chronic) (peripheral) Non-pressure chronic ulcer of other part of left lower leg with fat layer exposed Emphysema, unspecified Plan Discharge From [facility] Services: Discharge from Wound Care Center Treatment Complete 1. Would recommend that we going discontinue wound care services as the patient appears to be completely healed and this is great news. 2. I am going to recommend that she use a little bit of the triamcinolone on the area of the wound just in case she has any irritation but otherwise if everything seems to be doing well she can just leave this open to air no further treatment is necessary as everything is completely closed. We will see the patient back for follow-up visit as needed Electronic Signature(s) Signed: 07/11/2021 6:03:33 PM By: Lenda Kelp PA-C Entered By: Lenda Kelp on 07/11/2021 18:03:32 Dacquisto, Ople P. ([id number]) -------------------------------------------------------------------------------- SuperBill Details Patient Name: Ewer, Malone P. Date of Service: 07/11/2021 Medical Record Number: [id number] Patient Account Number: [id number] Date of Birth/Sex: 07-06-1946 (75 y.o. F) Treating RN: Rogers Blocker Primary Care Provider: Elizabeth Sauer Other Clinician: Referring Provider:  Elizabeth Sauer Treating Provider/Extender: Allen Derry Weeks in Treatment: 6 Diagnosis Coding ICD-10 Codes Code Description [phone] Varicose veins of bilateral lower extremities with other complications I87.2 Venous insufficiency (chronic) (peripheral) L97.822 Non-pressure chronic ulcer of other part of left lower leg with fat layer exposed J43.9 Emphysema, unspecified Facility Procedures CPT4 Code: [id number] Description: [phone] - WOUND CARE VISIT-LEV 2 EST PT Modifier: Quantity: 1 Physician Procedures CPT4 Code: [id number] Description: 99213 - WC PHYS LEVEL 3 - EST PT Modifier: Quantity: 1 CPT4 Code: Description: ICD-10 Diagnosis Description I83.893 Varicose veins of bilateral lower extremities with other complications I87.2 Venous insufficiency (chronic) (peripheral) L97.822 Non-pressure chronic ulcer of other part of left lower leg with fat lay  J43.9 Emphysema, unspecified Modifier: er exposed Quantity: Electronic Signature(s) Signed: 07/11/2021 6:03:47 PM By: Lenda Kelp PA-C Previous Signature: 07/11/2021 3:39:23 PM Version By: Rogers Blocker RN Entered By: Lenda Kelp on 07/11/2021 18:03:47

## 2021-08-02 ENCOUNTER — Ambulatory Visit (INDEPENDENT_AMBULATORY_CARE_PROVIDER_SITE_OTHER): Payer: Medicare Other

## 2021-08-02 VITALS — BP 122/72 | HR 60 | Temp 98.2°F | Resp 15 | Ht 64.0 in | Wt 137.2 lb

## 2021-08-02 DIAGNOSIS — Z0000 Encounter for general adult medical examination without abnormal findings: Secondary | ICD-10-CM

## 2021-08-02 DIAGNOSIS — Z122 Encounter for screening for malignant neoplasm of respiratory organs: Secondary | ICD-10-CM

## 2021-08-02 NOTE — Patient Instructions (Signed)
[name] , Thank you for taking time to come for your Medicare Wellness Visit. I appreciate your ongoing commitment to your health goals. Please review the following plan we discussed and let me know if I can assist you in the future.   Screening recommendations/referrals: Colonoscopy: declined Mammogram: declined Bone Density: done 05/01/19.  Recommended yearly ophthalmology/optometry visit for glaucoma screening and checkup Recommended yearly dental visit for hygiene and checkup  Vaccinations: Influenza vaccine: done 12/23/20 Pneumococcal vaccine: done 04/10/19; we will contact [facility] for Pneumovax23 vaccine information Tdap vaccine: due Shingles vaccine: Shingrix discussed. Please contact your pharmacy for coverage information.  Covid-19:done 01/30/20, 02/20/20, 09/30/20 & 03/17/21  Advanced directives: Please bring a copy of your health care power of attorney and living will to the office at your convenience.   Conditions/risks identified: If you wish to quit smoking, help is available. For free tobacco cessation program offerings call the [facility] at 670 302 2459 or Live Well Line at 253-435-4785. You may also visit [email or web address] or email [email or web address] for more information on other programs.   Next appointment: Follow up in one year for your annual wellness visit    Preventive Care 65 Years and Older, Female Preventive care refers to lifestyle choices and visits with your health care provider that can promote health and wellness. What does preventive care include? A yearly physical exam. This is also called an annual well check. Dental exams once or twice a year. Routine eye exams. Ask your health care provider how often you should have your eyes checked. Personal lifestyle choices, including: Daily care of your teeth and gums. Regular physical activity. Eating a healthy diet. Avoiding tobacco and drug use. Limiting alcohol  use. Practicing safe sex. Taking low-dose aspirin every day. Taking vitamin and mineral supplements as recommended by your health care provider. What happens during an annual well check? The services and screenings done by your health care provider during your annual well check will depend on your age, overall health, lifestyle risk factors, and family history of disease. Counseling  Your health care provider may ask you questions about your: Alcohol use. Tobacco use. Drug use. Emotional well-being. Home and relationship well-being. Sexual activity. Eating habits. History of falls. Memory and ability to understand (cognition). Work and work Astronomer. Reproductive health. Screening  You may have the following tests or measurements: Height, weight, and BMI. Blood pressure. Lipid and cholesterol levels. These may be checked every 5 years, or more frequently if you are over 52 years old. Skin check. Lung cancer screening. You may have this screening every year starting at age 53 if you have a 30-pack-year history of smoking and currently smoke or have quit within the past 15 years. Fecal occult blood test (FOBT) of the stool. You may have this test every year starting at age 81. Flexible sigmoidoscopy or colonoscopy. You may have a sigmoidoscopy every 5 years or a colonoscopy every 10 years starting at age 94. Hepatitis C blood test. Hepatitis B blood test. Sexually transmitted disease (STD) testing. Diabetes screening. This is done by checking your blood sugar (glucose) after you have not eaten for a while (fasting). You may have this done every 1-3 years. Bone density scan. This is done to screen for osteoporosis. You may have this done starting at age 31. Mammogram. This may be done every 1-2 years. Talk to your health care provider about how often you should have regular mammograms. Talk with your health care provider about your test results,  treatment options, and if necessary,  the need for more tests. Vaccines  Your health care provider may recommend certain vaccines, such as: Influenza vaccine. This is recommended every year. Tetanus, diphtheria, and acellular pertussis (Tdap, Td) vaccine. You may need a Td booster every 10 years. Zoster vaccine. You may need this after age 66. Pneumococcal 13-valent conjugate (PCV13) vaccine. One dose is recommended after age 51. Pneumococcal polysaccharide (PPSV23) vaccine. One dose is recommended after age 20. Talk to your health care provider about which screenings and vaccines you need and how often you need them. This information is not intended to replace advice given to you by your health care provider. Make sure you discuss any questions you have with your health care provider. Document Released: 12/30/2015 Document Revised: 08/22/2016 Document Reviewed: 10/04/2015 Elsevier Interactive Patient Education  2017 [place] Prevention in the Home Falls can cause injuries. They can happen to people of all ages. There are many things you can do to make your home safe and to help prevent falls. What can I do on the outside of my home? Regularly fix the edges of walkways and driveways and fix any cracks. Remove anything that might make you trip as you walk through a door, such as a raised step or threshold. Trim any bushes or trees on the path to your home. Use bright outdoor lighting. Clear any walking paths of anything that might make someone trip, such as rocks or tools. Regularly check to see if handrails are loose or broken. Make sure that both sides of any steps have handrails. Any raised decks and porches should have guardrails on the edges. Have any leaves, snow, or ice cleared regularly. Use sand or salt on walking paths during winter. Clean up any spills in your garage right away. This includes oil or grease spills. What can I do in the bathroom? Use night lights. Install grab bars by the toilet and in the  tub and shower. Do not use towel bars as grab bars. Use non-skid mats or decals in the tub or shower. If you need to sit down in the shower, use a plastic, non-slip stool. Keep the floor dry. Clean up any water that spills on the floor as soon as it happens. Remove soap buildup in the tub or shower regularly. Attach bath mats securely with double-sided non-slip rug tape. Do not have throw rugs and other things on the floor that can make you trip. What can I do in the bedroom? Use night lights. Make sure that you have a light by your bed that is easy to reach. Do not use any sheets or blankets that are too big for your bed. They should not hang down onto the floor. Have a firm chair that has side arms. You can use this for support while you get dressed. Do not have throw rugs and other things on the floor that can make you trip. What can I do in the kitchen? Clean up any spills right away. Avoid walking on wet floors. Keep items that you use a lot in easy-to-reach places. If you need to reach something above you, use a strong step stool that has a grab bar. Keep electrical cords out of the way. Do not use floor polish or wax that makes floors slippery. If you must use wax, use non-skid floor wax. Do not have throw rugs and other things on the floor that can make you trip. What can I do with my stairs? Do  not leave any items on the stairs. Make sure that there are handrails on both sides of the stairs and use them. Fix handrails that are broken or loose. Make sure that handrails are as long as the stairways. Check any carpeting to make sure that it is firmly attached to the stairs. Fix any carpet that is loose or worn. Avoid having throw rugs at the top or bottom of the stairs. If you do have throw rugs, attach them to the floor with carpet tape. Make sure that you have a light switch at the top of the stairs and the bottom of the stairs. If you do not have them, ask someone to add them for  you. What else can I do to help prevent falls? Wear shoes that: Do not have high heels. Have rubber bottoms. Are comfortable and fit you well. Are closed at the toe. Do not wear sandals. If you use a stepladder: Make sure that it is fully opened. Do not climb a closed stepladder. Make sure that both sides of the stepladder are locked into place. Ask someone to hold it for you, if possible. Clearly mark and make sure that you can see: Any grab bars or handrails. First and last steps. Where the edge of each step is. Use tools that help you move around (mobility aids) if they are needed. These include: Canes. Walkers. Scooters. Crutches. Turn on the lights when you go into a dark area. Replace any light bulbs as soon as they burn out. Set up your furniture so you have a clear path. Avoid moving your furniture around. If any of your floors are uneven, fix them. If there are any pets around you, be aware of where they are. Review your medicines with your doctor. Some medicines can make you feel dizzy. This can increase your chance of falling. Ask your doctor what other things that you can do to help prevent falls. This information is not intended to replace advice given to you by your health care provider. Make sure you discuss any questions you have with your health care provider. Document Released: 09/29/2009 Document Revised: 05/10/2016 Document Reviewed: 01/07/2015 Elsevier Interactive Patient Education  2017 [employer].

## 2021-08-02 NOTE — Progress Notes (Signed)
Subjective:   [name] is a 75 y.o. female who presents for an Initial Medicare Annual Wellness Visit.  Review of Systems     Cardiac Risk Factors include: advanced age (>4255men, 66>65 women);smoking/ tobacco exposure     Objective:    Today's Vitals   08/02/21 1052 08/02/21 1054  BP: 122/72   Pulse: 60   Resp: 15   Temp: 98.2 F (36.8 C)   TempSrc: Oral   SpO2: 96%   Weight: 137 lb 3.2 oz (62.2 kg)   Height: 5\' 4"  (1.626 m)   PainSc:  2    Body mass index is 23.55 kg/m.  Advanced Directives 08/02/2021  Does Patient Have a Medical Advance Directive? Yes  Type of Estate agentAdvance Directive Healthcare Power of [place];Living will  Copy of Healthcare Power of Attorney in Chart? No - copy requested    Current Medications (verified) Outpatient Encounter Medications as of 08/02/2021  Medication Sig   Calcium Carb-Cholecalciferol (CALCIUM 600-D PO) Take by mouth.   cetirizine-pseudoephedrine (ZYRTEC-D) 5-120 MG tablet Take by mouth. PRN   clindamycin (CLEOCIN) 300 MG capsule Take 300 mg by mouth 3 (three) times daily.   fluticasone (FLONASE) 50 MCG/ACT nasal spray Place 2 sprays into both nostrils 2 (two) times daily.   fluticasone-salmeterol (WIXELA INHUB) 250-50 MCG/ACT AEPB Inhale 1 puff into the lungs in the morning and at bedtime.   Multiple Vitamin (MULTIVITAMIN) capsule Take 1 capsule by mouth daily.   thyroid (ARMOUR) 60 MG tablet Take 60 mg by mouth daily before breakfast.   Vitamin D, Ergocalciferol, (DRISDOL) 50000 units CAPS capsule Take 50,000 Units by mouth every 7 (seven) days. Dr Nelia ShiMorayarti   albuterol (VENTOLIN HFA) 108 (90 Base) MCG/ACT inhaler Inhale 2 puffs into the lungs every 6 (six) hours as needed for wheezing or shortness of breath. (Patient not taking: Reported on 08/02/2021)   alendronate (FOSAMAX) 70 MG tablet Take 1 tablet by mouth once a week. Dr Nelia ShiMorayarti (Patient not taking: Reported on 08/02/2021)   [DISCONTINUED] neomycin-polymyxin b-dexamethasone  (MAXITROL) 3.5-10000-0.1 SUSP Place 1 drop into both eyes every 6 (six) hours. (Patient not taking: No sig reported)   No facility-administered encounter medications on file as of 08/02/2021.    Allergies (verified) Shrimp extract allergy skin test and Sulfa antibiotics   History: Past Medical History:  Diagnosis Date   Osteoarthritis    Osteoporosis    Thyroid disease    Past Surgical History:  Procedure Laterality Date   TONSILLECTOMY     TUBAL LIGATION     History reviewed. No pertinent family history. Social History   Socioeconomic History   Marital status: Married    Spouse name: Not on file   Number of children: 3   Years of education: Not on file   Highest education level: Not on file  Occupational History   Not on file  Tobacco Use   Smoking status: Every Day    Packs/day: 1.00    Types: Cigarettes   Smokeless tobacco: Never   Tobacco comments:    Pt down to 1/2 pack per day 08/02/21  Vaping Use   Vaping Use: Never used  Substance and Sexual Activity   Alcohol use: Yes    Alcohol/week: 1.0 standard drink    Types: 1 Glasses of wine per week    Comment: 1-2 times weekly   Drug use: No   Sexual activity: Not Currently  Other Topics Concern   Not on file  Social History Narrative   Not  on file   Social Determinants of Health   Financial Resource Strain: Low Risk    Difficulty of Paying Living Expenses: Not hard at all  Food Insecurity: No Food Insecurity   Worried About Programme researcher, broadcasting/film/video in the Last Year: Never true   Barista in the Last Year: Never true  Transportation Needs: No Transportation Needs   Lack of Transportation (Medical): No   Lack of Transportation (Non-Medical): No  Physical Activity: Inactive   Days of Exercise per Week: 0 days   Minutes of Exercise per Session: 0 min  Stress: No Stress Concern Present   Feeling of Stress : Only a little  Social Connections: Moderately Isolated   Frequency of Communication with Friends  and Family: More than three times a week   Frequency of Social Gatherings with Friends and Family: Twice a week   Attends Religious Services: Never   Database administrator or Organizations: No   Attends Engineer, structural: Never   Marital Status: Married    Tobacco Counseling Ready to quit: Yes Counseling given: Yes Tobacco comments: Pt down to 1/2 pack per day 08/02/21   Clinical Intake:  Pre-visit preparation completed: Yes  Pain : 0-10 Pain Score: 2  Pain Type: Acute pain Pain Location: Mouth (dental issues) Pain Descriptors / Indicators: Aching, Sore Pain Onset: In the past 7 days Pain Frequency: Constant     BMI - recorded: 23.55 Nutritional Status: BMI of 19-24  Normal Nutritional Risks: None Diabetes: No  How often do you need to have someone help you when you read instructions, pamphlets, or other written materials from your doctor or pharmacy?: 1 - Never    Interpreter Needed?: No  Information entered by :: Reather Littler LPN   Activities of Daily Living In your present state of health, do you have any difficulty performing the following activities: 08/02/2021 05/25/2021  Hearing? N N  Vision? N N  Difficulty concentrating or making decisions? N N  Walking or climbing stairs? N N  Dressing or bathing? N N  Doing errands, shopping? N N  Preparing Food and eating ? N -  Using the Toilet? N -  In the past six months, have you accidently leaked urine? N -  Do you have problems with loss of bowel control? N -  Managing your Medications? N -  Managing your Finances? N -  Housekeeping or managing your Housekeeping? N -  Some recent data might be hidden    Patient Care Team: Duanne Limerick, MD as PCP - General (Family Medicine) Patrecia Pace, Delsa Sale, MD as Attending Physician (Endocrinology)  Indicate any recent Medical Services you may have received from other than [facility] providers in the past year (date may be approximate).     Assessment:    This is a routine wellness examination for [place].  Hearing/Vision screen Hearing Screening - Comments:: Pt denies hearing difficulty Vision Screening - Comments:: Vision screenings done in [place]  Dietary issues and exercise activities discussed: Current Exercise Habits: The patient does not participate in regular exercise at present, Exercise limited by: None identified   Goals Addressed             This Visit's Progress    DIET - INCREASE WATER INTAKE       Recommend drinking 6-8 glasses of water per day        Depression Screen [facility] 2/9 Scores 08/02/2021 05/25/2021 12/21/2019 05/21/2018 04/09/2017  PHQ - 2 Score 0  0 1 0 0  PHQ- 9 Score - - 5 3 -    Fall Risk Fall Risk  08/02/2021 05/25/2021 12/21/2019 05/21/2018 04/09/2017  Falls in the past year? 0 0 1 No No  Number falls in past yr: 0 - 0 - -  Injury with Fall? 0 - 1 - -  Risk for fall due to : No Fall Risks - - - -  Follow up Falls prevention discussed Falls evaluation completed Falls evaluation completed - -    FALL RISK PREVENTION PERTAINING TO THE HOME:  Any stairs in or around the home? Yes  If so, are there any without handrails? Yes  2 steps to sunroom and bedroom Home free of loose throw rugs in walkways, pet beds, electrical cords, etc? Yes  Adequate lighting in your home to reduce risk of falls? Yes   ASSISTIVE DEVICES UTILIZED TO PREVENT FALLS:  Life alert? No  Use of a cane, walker or w/c? No  Grab bars in the bathroom? No  Shower chair or bench in shower? No  Elevated toilet seat or a handicapped toilet? Yes   TIMED UP AND GO:  Was the test performed? Yes .  Length of time to ambulate 10 feet: 5 sec.   Gait steady and fast without use of assistive device  Cognitive Function: Normal cognitive status assessed by direct observation by this Nurse Health Advisor. No abnormalities found.          Immunizations Immunization History  Administered Date(s) Administered   Influenza, High Dose Seasonal PF  12/23/2020   Influenza-Unspecified 08/18/2019   PFIZER(Purple Top)SARS-COV-2 Vaccination 01/30/2020, 02/20/2020, 09/30/2020, 03/17/2021   Pneumococcal Conjugate-13 04/10/2019    TDAP status: Due, Education has been provided regarding the importance of this vaccine. Advised may receive this vaccine at local pharmacy or Health Dept. Aware to provide a copy of the vaccination record if obtained from local pharmacy or Health Dept. Verbalized acceptance and understanding.  Flu Vaccine status: Up to date  Pneumococcal vaccine status: Up to date  Covid-19 vaccine status: Completed vaccines  Qualifies for Shingles Vaccine? Yes   Zostavax completed No   Shingrix Completed?: No.    Education has been provided regarding the importance of this vaccine. Patient has been advised to call insurance company to determine out of pocket expense if they have not yet received this vaccine. Advised may also receive vaccine at local pharmacy or Health Dept. Verbalized acceptance and understanding.  Screening Tests Health Maintenance  Topic Date Due   Zoster Vaccines- Shingrix (1 of 2) Never done   INFLUENZA VACCINE  07/17/2021   MAMMOGRAM  05/25/2022 (Originally 02/09/1998)   Hepatitis C Screening  05/25/2022 (Originally 02/09/1964)   PNA vac Low Risk Adult (2 of 2 - PPSV23) 05/25/2022 (Originally 04/09/2020)   DEXA SCAN  Completed   COVID-19 Vaccine  Completed   HPV VACCINES  Aged Out   COLONOSCOPY (Pts 45-22yrs Insurance coverage will need to be confirmed)  Discontinued   TETANUS/TDAP  Discontinued    Health Maintenance  Health Maintenance Due  Topic Date Due   Zoster Vaccines- Shingrix (1 of 2) Never done   INFLUENZA VACCINE  07/17/2021    Colorectal cancer screening: pt declines colonoscopy   Mammogram status: pt states she stopped doing them several years ago and declines repeat screening  Bone Density status: Completed 05/01/19. Results reflect: Bone density results: OSTEOPENIA. Repeat every 2  years. Ordered by Dr. Kerrie Pleasure  Lung Cancer Screening: (Low Dose CT Chest recommended if  Age 33-80 years, 30 pack-year currently smoking OR have quit w/in 15years.) does qualify.   Lung Cancer Screening Referral: sent today   Additional Screening:  Hepatitis C Screening: does qualify; postponed  Vision Screening: Recommended annual ophthalmology exams for early detection of glaucoma and other disorders of the eye. Is the patient up to date with their annual eye exam?  No  Who is the provider or what is the name of the office in which the patient attends annual eye exams? Eye provider in [place].   Dental Screening: Recommended annual dental exams for proper oral hygiene  Community Resource Referral / Chronic Care Management: CRR required this visit?  No   CCM required this visit?  No      Plan:     I have personally reviewed and noted the following in the patient's chart:   Medical and social history Use of alcohol, tobacco or illicit drugs  Current medications and supplements including opioid prescriptions. Patient is not currently taking opioid prescriptions. Functional ability and status Nutritional status Physical activity Advanced directives List of other physicians Hospitalizations, surgeries, and ER visits in previous 12 months Vitals Screenings to include cognitive, depression, and falls Referrals and appointments  In addition, I have reviewed and discussed with patient certain preventive protocols, quality metrics, and best practice recommendations. A written personalized care plan for preventive services as well as general preventive health recommendations were provided to patient.     Reather Littler, LPN   [id number]   Nurse Notes: pt currently having dental issues due to abscess and need for tooth removal for implants. Pt states Dr. Kerrie Pleasure d/c fosamax for 6 months while getting dental treatment.

## 2021-12-01 ENCOUNTER — Ambulatory Visit (INDEPENDENT_AMBULATORY_CARE_PROVIDER_SITE_OTHER): Payer: Medicare Other | Admitting: Family Medicine

## 2021-12-01 ENCOUNTER — Encounter: Payer: Self-pay | Admitting: Family Medicine

## 2021-12-01 ENCOUNTER — Other Ambulatory Visit: Payer: Self-pay

## 2021-12-01 VITALS — BP 130/70 | HR 72 | Ht 64.0 in | Wt 132.0 lb

## 2021-12-01 DIAGNOSIS — J432 Centrilobular emphysema: Secondary | ICD-10-CM | POA: Diagnosis not present

## 2021-12-01 MED ORDER — ALBUTEROL SULFATE HFA 108 (90 BASE) MCG/ACT IN AERS: 2.0000 | INHALATION_SPRAY | Freq: Four times a day (QID) | RESPIRATORY_TRACT | 11 refills | Status: DC | PRN

## 2021-12-01 MED ORDER — FLUTICASONE-SALMETEROL 250-50 MCG/ACT IN AEPB: 1.0000 | INHALATION_SPRAY | Freq: Two times a day (BID) | RESPIRATORY_TRACT | 5 refills | Status: DC

## 2021-12-01 NOTE — Progress Notes (Signed)
Date:  12/01/2021   Name:  [name]   DOB:  [birth year]   MRN:  [id number]   Chief Complaint: COPD and Allergic Rhinitis   COPD There is no chest tightness, cough, difficulty breathing, frequent throat clearing, hemoptysis, hoarse voice, shortness of breath, sputum production or wheezing. This is a chronic problem. The current episode started more than 1 year ago. The problem occurs intermittently. The problem has been gradually improving. Pertinent negatives include no chest pain, dyspnea on exertion, ear pain, fever, headaches, myalgias, nasal congestion, PND, sneezing or sore throat. Her symptoms are aggravated by nothing. Her past medical history is significant for COPD.   No results found for: NA, K, CO2, GLUCOSE, BUN, CREATININE, CALCIUM, EGFR, GFRNONAA, GLUCOSE No results found for: CHOL, HDL, LDLCALC, LDLDIRECT, TRIG, CHOLHDL No results found for: TSH No results found for: HGBA1C No results found for: WBC, HGB, HCT, MCV, PLT No results found for: ALT, AST, GGT, ALKPHOS, BILITOT No results found for: 25OHVITD2, 25OHVITD3, VD25OH   Review of Systems  Constitutional:  Negative for chills and fever.  HENT:  Negative for drooling, ear discharge, ear pain, hoarse voice, sneezing and sore throat.   Respiratory:  Negative for cough, hemoptysis, sputum production, shortness of breath and wheezing.   Cardiovascular:  Negative for chest pain, dyspnea on exertion, palpitations, leg swelling and PND.  Gastrointestinal:  Negative for abdominal pain, blood in stool, constipation, diarrhea and nausea.  Endocrine: Negative for polydipsia.  Genitourinary:  Negative for dysuria, frequency, hematuria and urgency.  Musculoskeletal:  Negative for back pain, myalgias and neck pain.  Skin:  Negative for rash.  Allergic/Immunologic: Negative for environmental allergies.  Neurological:  Negative for dizziness and headaches.  Hematological:  Does not bruise/bleed easily.   Psychiatric/Behavioral:  Negative for suicidal ideas. The patient is not nervous/anxious.    Patient Active Problem List   Diagnosis Date Noted   Centrilobular emphysema (HCC) 04/10/2019   Tobacco use 01/09/2016   Hypothyroidism 01/09/2016   Arthritis 01/09/2016    Allergies  Allergen Reactions   Shrimp Extract Allergy Skin Test Anaphylaxis    Has epipen   Sulfa Antibiotics     GI Bleeding    Past Surgical History:  Procedure Laterality Date   TONSILLECTOMY     TUBAL LIGATION      Social History   Tobacco Use   Smoking status: Every Day    Packs/day: 1.00    Types: Cigarettes   Smokeless tobacco: Never   Tobacco comments:    Pt down to 1/2 pack per day 08/02/21  Vaping Use   Vaping Use: Never used  Substance Use Topics   Alcohol use: Yes    Alcohol/week: 1.0 standard drink    Types: 1 Glasses of wine per week    Comment: 1-2 times weekly   Drug use: No     Medication list has been reviewed and updated.  Current Meds  Medication Sig   Calcium Carb-Cholecalciferol (CALCIUM 600-D PO) Take by mouth.   clindamycin (CLEOCIN) 300 MG capsule Take 300 mg by mouth 3 (three) times daily.   fluticasone (FLONASE) 50 MCG/ACT nasal spray Place 2 sprays into both nostrils 2 (two) times daily.   fluticasone-salmeterol (WIXELA INHUB) 250-50 MCG/ACT AEPB Inhale 1 puff into the lungs in the morning and at bedtime.   Multiple Vitamin (MULTIVITAMIN) capsule Take 1 capsule by mouth daily.   thyroid (ARMOUR) 60 MG tablet Take 60 mg by mouth daily before breakfast.    PHQ  2/9 Scores 12/01/2021 08/02/2021 05/25/2021 12/21/2019  PHQ - 2 Score 0 0 0 1  PHQ- 9 Score 0 - - 5    GAD 7 : Generalized Anxiety Score 12/01/2021 12/21/2019  Nervous, Anxious, on Edge 0 1  Control/stop worrying 0 0  Worry too much - different things 0 0  Trouble relaxing 0 0  Restless 0 1  Easily annoyed or irritable 0 1  Afraid - awful might happen 0 1  Total GAD 7 Score 0 4  Anxiety Difficulty - Not  difficult at all    BP Readings from Last 3 Encounters:  12/01/21 130/70  08/02/21 122/72  05/25/21 110/70    Physical Exam Vitals and nursing note reviewed.  Constitutional:      General: She is not in acute distress.    Appearance: She is not diaphoretic.  HENT:     Head: Normocephalic and atraumatic.     Right Ear: Tympanic membrane, ear canal and external ear normal. There is no impacted cerumen.     Left Ear: Tympanic membrane, ear canal and external ear normal. There is no impacted cerumen.     Nose: Nose normal. No congestion or rhinorrhea.  Eyes:     General:        Right eye: No discharge.        Left eye: No discharge.     Conjunctiva/sclera: Conjunctivae normal.     Pupils: Pupils are equal, round, and reactive to light.  Neck:     Thyroid: No thyromegaly.     Vascular: No JVD.  Cardiovascular:     Rate and Rhythm: Normal rate and regular rhythm.     Heart sounds: Normal heart sounds. No murmur heard.   No friction rub. No gallop.  Pulmonary:     Effort: Pulmonary effort is normal.     Breath sounds: Normal breath sounds. No wheezing, rhonchi or rales.  Abdominal:     General: Bowel sounds are normal.     Palpations: Abdomen is soft. There is no mass.     Tenderness: There is no abdominal tenderness. There is no guarding or rebound.  Musculoskeletal:        General: Normal range of motion.     Cervical back: Normal range of motion and neck supple.  Lymphadenopathy:     Cervical: No cervical adenopathy.  Skin:    General: Skin is warm and dry.  Neurological:     Mental Status: She is alert.     Deep Tendon Reflexes: Reflexes are normal and symmetric.    Wt Readings from Last 3 Encounters:  12/01/21 132 lb (59.9 kg)  08/02/21 137 lb 3.2 oz (62.2 kg)  05/25/21 136 lb (61.7 kg)    BP 130/70    Pulse 72    Ht $R'5\' 4"'ak$  (1.626 m)    Wt 132 lb (59.9 kg)    BMI 22.66 kg/m   Assessment and Plan:  1. Centrilobular emphysema (HCC) Chronic.  Episodic in terms  of symptomatology.  Stable.  Patient however has been using her medications on an as-needed basis.  In other words she has not taken her long-acting beta agonist and I have told her that this is sort of a preventative control of her COPD and that she at least needs to take this and may use her albuterol on a rescue basis and she understands and will resume her [place]. - fluticasone-salmeterol (WIXELA INHUB) 250-50 MCG/ACT AEPB; Inhale 1 puff into the lungs in the morning  and at bedtime.  Dispense: 60 each; Refill: 5 - albuterol (VENTOLIN HFA) 108 (90 Base) MCG/ACT inhaler; Inhale 2 puffs into the lungs every 6 (six) hours as needed for wheezing or shortness of breath.  Dispense: 8 g; Refill: 11

## 2022-03-09 ENCOUNTER — Other Ambulatory Visit: Payer: Self-pay | Admitting: *Deleted

## 2022-03-09 DIAGNOSIS — F17210 Nicotine dependence, cigarettes, uncomplicated: Secondary | ICD-10-CM

## 2022-03-09 DIAGNOSIS — Z87891 Personal history of nicotine dependence: Secondary | ICD-10-CM

## 2022-04-10 ENCOUNTER — Encounter: Payer: Self-pay | Admitting: Acute Care

## 2022-04-10 ENCOUNTER — Ambulatory Visit (INDEPENDENT_AMBULATORY_CARE_PROVIDER_SITE_OTHER): Payer: Medicare Other | Admitting: Acute Care

## 2022-04-10 DIAGNOSIS — F17210 Nicotine dependence, cigarettes, uncomplicated: Secondary | ICD-10-CM | POA: Diagnosis not present

## 2022-04-10 NOTE — Progress Notes (Signed)
Virtual Visit via Telephone Note ? ?I connected with [name] on 04/10/22 at  9:30 AM EDT by telephone and verified that I am speaking with the correct person using two identifiers. ? ?Location: ?Patient:  At home ?Provider: [place], [place], [place], [address]  ?  ?I discussed the limitations, risks, security and privacy concerns of performing an evaluation and management service by telephone and the availability of in person appointments. I also discussed with the patient that there may be a patient responsible charge related to this service. The patient expressed understanding and agreed to proceed. ? ? ?Shared Decision Making Visit Lung Cancer Screening Program ?([id number]) ? ? ?Eligibility: ?Age 76 y.o. ?Pack Years Smoking History Calculation 56 pack year smoking history ?(# packs/per year x # years smoked) ?Recent History of coughing up blood  no ?Unexplained weight loss? no ?( >Than 15 pounds within the last 6 months ) ?Prior History Lung / other cancer no ?(Diagnosis within the last 5 years already requiring surveillance chest CT Scans). ?Smoking Status Current Smoker ?Former Smokers: Years since quit: NA ? Quit Date: NA ? ?Visit Components: ?Discussion included one or more decision making aids. yes ?Discussion included risk/benefits of screening. yes ?Discussion included potential follow up diagnostic testing for abnormal scans. yes ?Discussion included meaning and risk of over diagnosis. yes ?Discussion included meaning and risk of False Positives. yes ?Discussion included meaning of total radiation exposure. yes ? ?Counseling Included: ?Importance of adherence to annual lung cancer LDCT screening. yes ?Impact of comorbidities on ability to participate in the program. yes ?Ability and willingness to under diagnostic treatment. yes ? ?Smoking Cessation Counseling: ?Current Smokers:  ?Discussed importance of smoking cessation. yes ?Information about tobacco cessation classes and  interventions provided to patient. yes ?Patient provided with "ticket" for LDCT Scan. yes ?Symptomatic Patient. no ? Counseling NA ?Diagnosis Code: Tobacco Use Z72.0 ?Asymptomatic Patient yes ? Counseling (Intermediate counseling: > three minutes counseling) [id number] ?Former Smokers:  ?Discussed the importance of maintaining cigarette abstinence. yes ?Diagnosis Code: Personal History of Nicotine Dependence. [id number] ?Information about tobacco cessation classes and interventions provided to patient. Yes ?Patient provided with "ticket" for LDCT Scan. yes ?Written Order for Lung Cancer Screening with LDCT placed in Epic. Yes ?(CT Chest Lung Cancer Screening Low Dose W/O CM) [id number] ?Z12.2-Screening of respiratory organs ?Z87.891-Personal history of nicotine dependence ? ?I have spent 25 minutes of face to face/ virtual visit   time with  [name] discussing the risks and benefits of lung cancer screening. We viewed / discussed a power point together that explained in detail the above noted topics. We paused at intervals to allow for questions to be asked and answered to ensure understanding.We discussed that the single most powerful action that she can take to decrease her risk of developing lung cancer is to quit smoking. We discussed whether or not she is ready to commit to setting a quit date. We discussed options for tools to aid in quitting smoking including nicotine replacement therapy, non-nicotine medications, support groups, Quit Smart classes, and behavior modification. We discussed that often times setting smaller, more achievable goals, such as eliminating 1 cigarette a day for a week and then 2 cigarettes a day for a week can be helpful in slowly decreasing the number of cigarettes smoked. This allows for a sense of accomplishment as well as providing a clinical benefit. I provided  him  with smoking cessation  information  with contact information for community resources, classes, free nicotine  replacement  therapy, and access to mobile apps, text messaging, and on-line smoking cessation help. I have also provided  him  the office contact information in the event he needs to contact me, or the screening staff. We discussed the time and location of the scan, and that either Doroteo Glassman RN, Joella Prince, RN  or I will call / send a letter with the results within 24-72 hours of receiving them. The patient verbalized understanding of all of  the above and had no further questions upon leaving the office. They have my contact information in the event they have any further questions. ? ?I spent 3 minutes counseling on smoking cessation and the health risks of continued tobacco abuse. ? ?I explained to the patient that there has been a high incidence of coronary artery disease noted on these exams. I explained that this is a non-gated exam therefore degree or severity cannot be determined. This patient is not on statin therapy. I have asked the patient to follow-up with their PCP regarding any incidental finding of coronary artery disease and management with diet or medication as their PCP  feels is clinically indicated. The patient verbalized understanding of the above and had no further questions upon completion of the visit. ? ?  ? ? ?Magdalen Spatz, NP ?04/10/2022 ? ? ? ? ? ? ?

## 2022-04-10 NOTE — Patient Instructions (Signed)
Thank you for participating in the [facility]. It was our pleasure to meet you today. We will call you with the results of your scan within the next few days. Your scan will be assigned a Lung RADS category score by the physicians reading the scans.  This Lung RADS score determines follow up scanning.  See below for description of categories, and follow up screening recommendations. We will be in touch to schedule your follow up screening annually or based on recommendations of our providers. We will fax a copy of your scan results to your Primary Care Physician, or the physician who referred you to the program, to ensure they have the results. Please call the office if you have any questions or concerns regarding your scanning experience or results.  Our office number is [phone]. Please speak with Denise Phelps, RN. , or  Denise Buckner RN, They are  our Lung Cancer Screening RN.'s If They are unavailable when you call, Please leave a message on the voice mail. We will return your call at our earliest convenience.This voice mail is monitored several times a day.  Remember, if your scan is normal, we will scan you annually as long as you continue to meet the criteria for the program. (Age 55-77, Current smoker or smoker who has quit within the last 15 years). If you are a smoker, remember, quitting is the single most powerful action that you can take to decrease your risk of lung cancer and other pulmonary, breathing related problems. We know quitting is hard, and we are here to help.  Please let us know if there is anything we can do to help you meet your goal of quitting. If you are a former smoker, congratulations. We are proud of you! Remain smoke free! Remember you can refer friends or family members through the number above.  We will screen them to make sure they meet criteria for the program. Thank you for helping us take better care of you by  participating in Lung Screening.  You can receive free nicotine replacement therapy ( patches, gum or mints) by calling 1-800-QUIT NOW. Please call so we can get you on the path to becoming  a non-smoker. I know it is hard, but you can do this!  Lung RADS Categories:  Lung RADS 1: no nodules or definitely non-concerning nodules.  Recommendation is for a repeat annual scan in 12 months.  Lung RADS 2:  nodules that are non-concerning in appearance and behavior with a very low likelihood of becoming an active cancer. Recommendation is for a repeat annual scan in 12 months.  Lung RADS 3: nodules that are probably non-concerning , includes nodules with a low likelihood of becoming an active cancer.  Recommendation is for a 6-month repeat screening scan. Often noted after an upper respiratory illness. We will be in touch to make sure you have no questions, and to schedule your 6-month scan.  Lung RADS 4 A: nodules with concerning findings, recommendation is most often for a follow up scan in 3 months or additional testing based on our provider's assessment of the scan. We will be in touch to make sure you have no questions and to schedule the recommended 3 month follow up scan.  Lung RADS 4 B:  indicates findings that are concerning. We will be in touch with you to schedule additional diagnostic testing based on our provider's  assessment of the scan.  Other options for assistance in smoking cessation (   As covered by your insurance benefits)  Hypnosis for smoking cessation  [facility] [employer] [phone]  Acupuncture for smoking cessation  [facility] [phone]   

## 2022-04-11 ENCOUNTER — Ambulatory Visit
Admission: RE | Admit: 2022-04-11 | Discharge: 2022-04-11 | Disposition: A | Discharge status: Home / Self Care | Payer: Medicare Other | Source: Ambulatory Visit | Attending: Acute Care | Admitting: Acute Care

## 2022-04-11 DIAGNOSIS — Z87891 Personal history of nicotine dependence: Secondary | ICD-10-CM | POA: Diagnosis present

## 2022-04-11 DIAGNOSIS — F17210 Nicotine dependence, cigarettes, uncomplicated: Secondary | ICD-10-CM | POA: Diagnosis not present

## 2022-04-17 ENCOUNTER — Other Ambulatory Visit: Payer: Self-pay

## 2022-04-17 ENCOUNTER — Telehealth: Payer: Self-pay | Admitting: Acute Care

## 2022-04-17 DIAGNOSIS — Z87891 Personal history of nicotine dependence: Secondary | ICD-10-CM

## 2022-04-17 DIAGNOSIS — R911 Solitary pulmonary nodule: Secondary | ICD-10-CM

## 2022-04-17 DIAGNOSIS — F17210 Nicotine dependence, cigarettes, uncomplicated: Secondary | ICD-10-CM

## 2022-04-19 ENCOUNTER — Other Ambulatory Visit: Payer: Self-pay

## 2022-04-19 DIAGNOSIS — Z87891 Personal history of nicotine dependence: Secondary | ICD-10-CM

## 2022-04-19 DIAGNOSIS — Z122 Encounter for screening for malignant neoplasm of respiratory organs: Secondary | ICD-10-CM

## 2022-04-19 DIAGNOSIS — F17210 Nicotine dependence, cigarettes, uncomplicated: Secondary | ICD-10-CM

## 2022-04-19 NOTE — Telephone Encounter (Signed)
Please call patient and let her know her scan was read as a Lung RADS 2: nodules that are benign in appearance and behavior with a very low likelihood of becoming a clinically active cancer due to size or lack of growth. Recommendation per radiology is for a repeat LDCT in 12 months.  ?Please fax results to PCP and order 12 month follow up. Thanks so much ?

## 2022-04-19 NOTE — Telephone Encounter (Signed)
Spoke with patient by phone to review LDCT results.  No suspicious findings for lung cancer were noted.  Patient did mention she had chest cold symptoms a week or so before the scan was done and has now improved.  Atherosclerosis and emphysema were also noted.  Patient is not on statin medication but will discuss with her PCP, as she recently had labwork and was told her cholesterol was elevated 'a little.'  She had no further questions and will place order for annual scan and route results to PCP.   ?

## 2022-06-01 ENCOUNTER — Ambulatory Visit (INDEPENDENT_AMBULATORY_CARE_PROVIDER_SITE_OTHER): Payer: Medicare Other | Admitting: Family Medicine

## 2022-06-01 ENCOUNTER — Encounter: Payer: Self-pay | Admitting: Family Medicine

## 2022-06-01 VITALS — BP 100/60 | HR 76 | Ht 64.0 in | Wt 132.0 lb

## 2022-06-01 DIAGNOSIS — I700 Atherosclerosis of aorta: Secondary | ICD-10-CM | POA: Diagnosis not present

## 2022-06-01 DIAGNOSIS — J432 Centrilobular emphysema: Secondary | ICD-10-CM

## 2022-06-01 NOTE — Patient Instructions (Signed)

## 2022-06-01 NOTE — Progress Notes (Signed)
Date:  06/01/2022   Name:  [name]   DOB:  04/10/1946   MRN:  [id number]   Chief Complaint: Follow-up (New Dx atherosclerosis and emphysema on CT in April) and COPD  COPD There is no chest tightness, cough, difficulty breathing, frequent throat clearing, hoarse voice, shortness of breath or wheezing. This is a chronic problem. The current episode started more than 1 year ago. The problem occurs intermittently. The problem has been gradually improving. Pertinent negatives include no chest pain, dyspnea on exertion, ear pain, fever, headaches, myalgias or sore throat. She reports moderate improvement on treatment. Her past medical history is significant for COPD.  Hyperlipidemia This is a chronic problem. The current episode started more than 1 year ago. The problem is controlled. Recent lipid tests were reviewed and are normal. She has no history of chronic renal disease, diabetes, hypothyroidism, liver disease, obesity or nephrotic syndrome. Pertinent negatives include no chest pain, myalgias or shortness of breath. Current antihyperlipidemic treatment includes statins.    No results found for: "NA", "K", "CO2", "GLUCOSE", "BUN", "CREATININE", "CALCIUM", "EGFR", "GFRNONAA" No results found for: "CHOL", "HDL", "LDLCALC", "LDLDIRECT", "TRIG", "CHOLHDL" No results found for: "TSH" No results found for: "HGBA1C" No results found for: "WBC", "HGB", "HCT", "MCV", "PLT" No results found for: "ALT", "AST", "GGT", "ALKPHOS", "BILITOT" No results found for: "25OHVITD2", "25OHVITD3", "VD25OH"   Review of Systems  Constitutional:  Negative for chills and fever.  HENT:  Negative for drooling, ear discharge, ear pain, hoarse voice and sore throat.   Respiratory:  Negative for cough, shortness of breath and wheezing.   Cardiovascular:  Negative for chest pain, dyspnea on exertion, palpitations and leg swelling.  Gastrointestinal:  Negative for abdominal pain, blood in stool, constipation,  diarrhea and nausea.  Endocrine: Negative for polydipsia.  Genitourinary:  Negative for dysuria, frequency, hematuria and urgency.  Musculoskeletal:  Negative for back pain, myalgias and neck pain.  Skin:  Negative for rash.  Allergic/Immunologic: Negative for environmental allergies.  Neurological:  Negative for dizziness and headaches.  Hematological:  Does not bruise/bleed easily.  Psychiatric/Behavioral:  Negative for suicidal ideas. The patient is not nervous/anxious.     Patient Active Problem List   Diagnosis Date Noted   Centrilobular emphysema (HCC) 04/10/2019   Tobacco use 01/09/2016   Hypothyroidism 01/09/2016   Arthritis 01/09/2016    Allergies  Allergen Reactions   Shrimp Extract Allergy Skin Test Anaphylaxis    Has epipen   Sulfa Antibiotics     GI Bleeding    Past Surgical History:  Procedure Laterality Date   TONSILLECTOMY     TUBAL LIGATION      Social History   Tobacco Use   Smoking status: Every Day    Packs/day: 1.00    Types: Cigarettes   Smokeless tobacco: Never   Tobacco comments:    Pt down to 1/2 pack per day 08/02/21  Vaping Use   Vaping Use: Never used  Substance Use Topics   Alcohol use: Yes    Alcohol/week: 1.0 standard drink of alcohol    Types: 1 Glasses of wine per week    Comment: 1-2 times weekly   Drug use: No     Medication list has been reviewed and updated.  Current Meds  Medication Sig   albuterol (VENTOLIN HFA) 108 (90 Base) MCG/ACT inhaler Inhale 2 puffs into the lungs every 6 (six) hours as needed for wheezing or shortness of breath.   Calcium Carb-Cholecalciferol (CALCIUM 600-D PO) Take by  mouth.   fluticasone (FLONASE) 50 MCG/ACT nasal spray Place 2 sprays into both nostrils 2 (two) times daily.   fluticasone-salmeterol (WIXELA INHUB) 250-50 MCG/ACT AEPB Inhale 1 puff into the lungs in the morning and at bedtime.   Multiple Vitamin (MULTIVITAMIN) capsule Take 1 capsule by mouth daily.   thyroid (ARMOUR) 60 MG  tablet Take 60 mg by mouth daily before breakfast.   Vitamin D, Ergocalciferol, (DRISDOL) 50000 units CAPS capsule Take 50,000 Units by mouth every 7 (seven) days. Dr Truddie Coco       06/01/2022    9:32 AM 12/01/2021   10:25 AM 12/21/2019   10:20 AM  GAD 7 : Generalized Anxiety Score  Nervous, Anxious, on Edge 1 0 1  Control/stop worrying 1 0 0  Worry too much - different things 1 0 0  Trouble relaxing 1 0 0  Restless 0 0 1  Easily annoyed or irritable 1 0 1  Afraid - awful might happen 0 0 1  Total GAD 7 Score 5 0 4  Anxiety Difficulty Not difficult at all  Not difficult at all       06/01/2022    9:32 AM  Depression screen PHQ 2/9  Decreased Interest 1  Down, Depressed, Hopeless 1  PHQ - 2 Score 2  Altered sleeping 1  Tired, decreased energy 1  Change in appetite 0  Feeling bad or failure about yourself  0  Trouble concentrating 0  Moving slowly or fidgety/restless 0  Suicidal thoughts 0  PHQ-9 Score 4  Difficult doing work/chores Not difficult at all    BP Readings from Last 3 Encounters:  06/01/22 100/60  12/01/21 130/70  08/02/21 122/72    Physical Exam Vitals and nursing note reviewed.  HENT:     Right Ear: Tympanic membrane normal.     Left Ear: Tympanic membrane normal.     Nose: Nose normal.  Cardiovascular:     Heart sounds: No murmur heard.    No gallop.  Pulmonary:     Effort: No respiratory distress.     Breath sounds: No stridor. No wheezing or rhonchi.     Wt Readings from Last 3 Encounters:  06/01/22 132 lb (59.9 kg)  12/01/21 132 lb (59.9 kg)  08/02/21 137 lb 3.2 oz (62.2 kg)    BP 100/60   Pulse 76   Ht $R'5\' 4"'oB$  (1.626 m)   Wt 132 lb (59.9 kg)   BMI 22.66 kg/m   Assessment and Plan:  1. Centrilobular emphysema (HCC) Chronic.  Persistent.  Patient is onWixela and albuterol.  Patient is only taking the long-acting beta agonist and I have suggested that she also takes short acting with it and not wait for an emergency situation.  2.  Aortic atherosclerosis (HCC) Chronic.  Uncontrolled.  Stable.  Uncertain what lipids is that it is being followed by Dr. Francoise Schaumann.  I have given patient guidelines to help with cholesterol control and patient understands that this is necessary to bring down to reduce her risk of stroke and heart attack.

## 2022-06-14 ENCOUNTER — Other Ambulatory Visit: Payer: Self-pay | Admitting: Family Medicine

## 2022-06-14 DIAGNOSIS — J432 Centrilobular emphysema: Secondary | ICD-10-CM

## 2022-06-14 MED ORDER — ALBUTEROL SULFATE HFA 108 (90 BASE) MCG/ACT IN AERS: 2.0000 | INHALATION_SPRAY | Freq: Four times a day (QID) | RESPIRATORY_TRACT | 6 refills | Status: DC | PRN

## 2022-06-14 MED ORDER — FLUTICASONE-SALMETEROL 250-50 MCG/ACT IN AEPB: 1.0000 | INHALATION_SPRAY | Freq: Two times a day (BID) | RESPIRATORY_TRACT | 5 refills | Status: DC

## 2022-06-14 NOTE — Telephone Encounter (Signed)
Requested Prescriptions  Pending Prescriptions Disp Refills  . fluticasone-salmeterol (WIXELA INHUB) 250-50 MCG/ACT AEPB 60 each 5    Sig: Inhale 1 puff into the lungs in the morning and at bedtime.     Pulmonology:  Combination Products Passed - 06/14/2022  5:16 PM      Passed - Valid encounter within last 12 months    Recent Outpatient Visits          1 week ago Centrilobular emphysema (HCC)   [facility] Duanne Limerick, MD   6 months ago Centrilobular emphysema [facility]   [facility] Duanne Limerick, MD   1 year ago Cellulitis of left lower extremity   [facility] Duanne Limerick, MD   2 years ago Centrilobular emphysema [facility])   [facility] Duanne Limerick, MD   3 years ago Acute maxillary sinusitis, recurrence not specified   [facility] Duanne Limerick, MD             . albuterol (VENTOLIN HFA) 108 (90 Base) MCG/ACT inhaler 8 g 6    Sig: Inhale 2 puffs into the lungs every 6 (six) hours as needed for wheezing or shortness of breath.     Pulmonology:  Beta Agonists 2 Passed - 06/14/2022  5:16 PM      Passed - Last BP in normal range    BP Readings from Last 1 Encounters:  06/01/22 100/60         Passed - Last Heart Rate in normal range    Pulse Readings from Last 1 Encounters:  06/01/22 76         Passed - Valid encounter within last 12 months    Recent Outpatient Visits          1 week ago Centrilobular emphysema (HCC)   [facility] Duanne Limerick, MD   6 months ago Centrilobular emphysema [facility])   [facility] Duanne Limerick, MD   1 year ago Cellulitis of left lower extremity   [facility] Duanne Limerick, MD   2 years ago Centrilobular emphysema [facility])   [facility] Duanne Limerick, MD   3 years ago Acute maxillary sinusitis, recurrence not specified   [facility] Duanne Limerick, MD

## 2022-06-14 NOTE — Telephone Encounter (Signed)
Medication Refill - Medication: albuterol (VENTOLIN HFA) 108 (90 Base) MCG/ACT inhaler  fluticasone-salmeterol (WIXELA INHUB) 250-50 MCG/ACT AEPB   Has the patient contacted their pharmacy? Yes.   (Agent: If no, request that the patient contact the pharmacy for the refill. If patient does not wish to contact the pharmacy document the reason why and proceed with request.) (Agent: If yes, when and what did the pharmacy advise?)  Preferred Pharmacy (with phone number or street name):   [facility] Eulis Manly, [place] - [address]  [address] [place] [address]  Phone: [phone] Fax: [phone]   Has the patient been seen for an appointment in the last year OR does the patient have an upcoming appointment? Yes.    Agent: Please be advised that RX refills may take up to 3 business days. We ask that you follow-up with your pharmacy.

## 2022-06-25 ENCOUNTER — Other Ambulatory Visit: Payer: Self-pay | Admitting: Family Medicine

## 2022-06-25 DIAGNOSIS — J432 Centrilobular emphysema: Secondary | ICD-10-CM

## 2022-06-26 NOTE — Telephone Encounter (Signed)
Requested medication (s) are due for refill today: no  Requested medication (s) are on the active medication list: yes  Last refill:  06/14/22  Future visit scheduled: yes  Notes to clinic:  Unable to refill per protocol, Rx request is too soon, last refill 06/14/22 for 90 day supply.     Requested Prescriptions  Pending Prescriptions Disp Refills   albuterol (VENTOLIN HFA) 108 (90 Base) MCG/ACT inhaler [Pharmacy Med Name: ALBUTEROL SULFATE HFA 108 (90 BASE)] 18 g     Sig: INHALE 2 PUFFS INTO THE LUNGS EVERY 6 HOURS AS NEEDED FOR WHEEZING OR SHORTNESS OF BREATH     Pulmonology:  Beta Agonists 2 Passed - 06/25/2022  4:58 PM      Passed - Last BP in normal range    BP Readings from Last 1 Encounters:  06/01/22 100/60         Passed - Last Heart Rate in normal range    Pulse Readings from Last 1 Encounters:  06/01/22 76         Passed - Valid encounter within last 12 months    Recent Outpatient Visits           3 weeks ago Centrilobular emphysema (HCC)   [facility] Duanne Limerick, MD   6 months ago Centrilobular emphysema (HCC)   [facility] Duanne Limerick, MD   1 year ago Cellulitis of left lower extremity   [facility] Duanne Limerick, MD   2 years ago Centrilobular emphysema [facility])   [facility] Duanne Limerick, MD   3 years ago Acute maxillary sinusitis, recurrence not specified   [facility] Duanne Limerick, MD               [facility] INHUB 250-50 MCG/ACT AEPB [Pharmacy Med Name: [place] INHUB 250-50 MCG/DOSE INH AE] 60 each 5    Sig: INHALE ONE PUFF TWICE DAILY     Pulmonology:  Combination Products Passed - 06/25/2022  4:58 PM      Passed - Valid encounter within last 12 months    Recent Outpatient Visits           3 weeks ago Centrilobular emphysema (HCC)   [facility] Duanne Limerick, MD   6 months ago Centrilobular emphysema [facility])   [facility] Duanne Limerick, MD   1 year ago  Cellulitis of left lower extremity   [facility] Duanne Limerick, MD   2 years ago Centrilobular emphysema [facility])   [facility] Duanne Limerick, MD   3 years ago Acute maxillary sinusitis, recurrence not specified   [facility] Duanne Limerick, MD

## 2022-08-06 ENCOUNTER — Ambulatory Visit: Payer: Medicare Other

## 2022-08-10 ENCOUNTER — Ambulatory Visit: Payer: Medicare Other

## 2022-09-05 ENCOUNTER — Ambulatory Visit: Payer: Medicare Other

## 2022-09-26 ENCOUNTER — Ambulatory Visit (INDEPENDENT_AMBULATORY_CARE_PROVIDER_SITE_OTHER): Payer: Medicare Other

## 2022-09-26 VITALS — Ht 64.0 in

## 2022-09-26 DIAGNOSIS — Z0000 Encounter for general adult medical examination without abnormal findings: Secondary | ICD-10-CM

## 2022-09-26 NOTE — Progress Notes (Signed)
Subjective:   [name] is a 75 y.o. female who presents for Medicare Annual (Subsequent) preventive examination.  I connected with  Baron Sane on 09/26/22 by a audio enabled telemedicine application and verified that I am speaking with the correct person using two identifiers.  Patient Location: Home  Provider Location: Office/Clinic  I discussed the limitations of evaluation and management by telemedicine. The patient expressed understanding and agreed to proceed.    Review of Systems    Defer to PCP Cardiac Risk Factors include: advanced age (>47men, >62 women)     Objective:    Today's Vitals   09/26/22 1047 09/26/22 1053  Height: 5\' 4"  (1.626 m)   PainSc: 0-No pain 0-No pain   Body mass index is 22.66 kg/m.     09/26/2022   10:54 AM 08/02/2021   11:14 AM  Advanced Directives  Does Patient Have a Medical Advance Directive? Yes Yes  Type of Advance Directive Living will [place];Living will  Copy of [place] in Chart?  No - copy requested    Current Medications (verified) Outpatient Encounter Medications as of 09/26/2022  Medication Sig   albuterol (VENTOLIN HFA) 108 (90 Base) MCG/ACT inhaler INHALE 2 PUFFS INTO THE LUNGS EVERY 6 HOURS AS NEEDED FOR WHEEZING OR SHORTNESS OF BREATH   alendronate (FOSAMAX) 70 MG tablet Take 1 tablet by mouth once a week. Dr Truddie Coco   Calcium Carb-Cholecalciferol (CALCIUM 600-D PO) Take by mouth.   cetirizine-pseudoephedrine (ZYRTEC-D) 5-120 MG tablet Take by mouth. PRN   fluticasone (FLONASE) 50 MCG/ACT nasal spray Place 2 sprays into both nostrils 2 (two) times daily.   Multiple Vitamin (MULTIVITAMIN) capsule Take 1 capsule by mouth daily.   thyroid (ARMOUR) 60 MG tablet Take 60 mg by mouth daily before breakfast.   Vitamin D, Ergocalciferol, (DRISDOL) 50000 units CAPS capsule Take 50,000 Units by mouth every 7 (seven) days. Dr Theodosia Blender INHUB 250-50 MCG/ACT AEPB  INHALE ONE PUFF TWICE DAILY   No facility-administered encounter medications on file as of 09/26/2022.    Allergies (verified) Shrimp extract allergy skin test and Sulfa antibiotics   History: Past Medical History:  Diagnosis Date   Aortic atherosclerosis ([place])    Emphysema lung (HCC)    Osteoarthritis    Osteoporosis    Thyroid disease    Past Surgical History:  Procedure Laterality Date   TONSILLECTOMY     TUBAL LIGATION     History reviewed. No pertinent family history. Social History   Socioeconomic History   Marital status: Married    Spouse name: Not on file   Number of children: 3   Years of education: Not on file   Highest education level: Not on file  Occupational History   Not on file  Tobacco Use   Smoking status: Every Day    Packs/day: 0.50    Years: 50.00    Total pack years: 25.00    Types: Cigarettes   Smokeless tobacco: Never  Vaping Use   Vaping Use: Never used  Substance and Sexual Activity   Alcohol use: Yes    Alcohol/week: 1.0 standard drink of alcohol    Types: 1 Glasses of wine per week    Comment: 1-2 times weekly   Drug use: No   Sexual activity: Not Currently  Other Topics Concern   Not on file  Social History Narrative   Not on file   Social Determinants of Health   Financial  Resource Strain: Low Risk  (08/02/2021)   Overall Financial Resource Strain (CARDIA)    Difficulty of Paying Living Expenses: Not hard at all  Food Insecurity: No Food Insecurity (08/02/2021)   Hunger Vital Sign    Worried About Running Out of Food in the Last Year: Never true    Ran Out of Food in the Last Year: Never true  Transportation Needs: No Transportation Needs (08/02/2021)   PRAPARE - Hydrologist (Medical): No    Lack of Transportation (Non-Medical): No  Physical Activity: Inactive (09/26/2022)   Exercise Vital Sign    Days of Exercise per Week: 0 days    Minutes of Exercise per Session: 0 min  Stress: No Stress  Concern Present (08/02/2021)   [place]    Feeling of Stress : Only a little  Social Connections: Moderately Isolated (09/26/2022)   Social Connection and Isolation Panel [NHANES]    Frequency of Communication with Friends and Family: More than three times a week    Frequency of Social Gatherings with Friends and Family: Three times a week    Attends Religious Services: Never    Active Member of Clubs or Organizations: No    Attends Archivist Meetings: Never    Marital Status: Married    Tobacco Counseling Ready to quit: No Counseling given: Yes   Clinical Intake:  Pre-visit preparation completed: Yes  Pain : No/denies pain Pain Score: 0-No pain     Diabetes: No     Diabetic? No.     Information entered by :: Brittane Grudzinski Suzan Garibaldi, CMA   Activities of Daily Living    09/26/2022   10:56 AM  In your present state of health, do you have any difficulty performing the following activities:  Hearing? 0  Vision? 0  Difficulty concentrating or making decisions? 0  Walking or climbing stairs? 0  Dressing or bathing? 0  Doing errands, shopping? 0  Preparing Food and eating ? N  Using the Toilet? N  In the past six months, have you accidently leaked urine? N  Do you have problems with loss of bowel control? N  Managing your Medications? N  Managing your Finances? N  Housekeeping or managing your Housekeeping? N    Patient Care Team: Juline Patch, MD as PCP - General (Family Medicine) Ronnald Collum, Lourdes Sledge, MD as Attending Physician (Endocrinology)  Indicate any recent Medical Services you may have received from other than [facility] providers in the past year (date may be approximate).     Assessment:   This is a routine wellness examination for [place].  Hearing/Vision screen Hearing Screening - Comments:: No concerns. Vision Screening - Comments:: Wears prescription glasses.  Dietary issues  and exercise activities discussed:     Goals Addressed               This Visit's Progress     Patient Stated     patient stated (pt-stated)        Wants to be more active.      Depression Screen    09/26/2022   10:55 AM 06/01/2022    9:32 AM 12/01/2021   10:25 AM 08/02/2021   11:12 AM 05/25/2021   10:38 AM 12/21/2019   10:18 AM 05/21/2018   12:07 PM  PHQ 2/9 Scores  PHQ - 2 Score 0 2 0 0 0 1 0  PHQ- 9 Score 0 4 0   5 3  Fall Risk    09/26/2022   10:55 AM 06/01/2022    9:33 AM 08/02/2021   11:16 AM 05/25/2021   10:38 AM 12/21/2019   10:17 AM  Fall Risk   Falls in the past year? 0 0 0 0 1  Number falls in past yr: 0 0 0  0  Injury with Fall? 0 0 0  1  Risk for fall due to : No Fall Risks No Fall Risks No Fall Risks    Follow up Falls evaluation completed Falls evaluation completed Falls prevention discussed Falls evaluation completed Falls evaluation completed    [place]:  Any stairs in or around the home? Yes  If so, are there any without handrails? Yes  Home free of loose throw rugs in walkways, pet beds, electrical cords, etc? Yes  Adequate lighting in your home to reduce risk of falls? Yes   ASSISTIVE DEVICES UTILIZED TO PREVENT FALLS:  Life alert? No  Use of a cane, walker or w/c? No  Grab bars in the bathroom? No  Shower chair or bench in shower? No  Elevated toilet seat or a handicapped toilet? Yes   Cognitive Function:        09/26/2022   10:57 AM  6CIT Screen  What Year? 0 points  What month? 0 points  What time? 0 points  Count back from 20 0 points  Months in reverse 0 points  Repeat phrase 2 points  Total Score 2 points    Immunizations Immunization History  Administered Date(s) Administered   Influenza, High Dose Seasonal PF 12/23/2020   Influenza-Unspecified 08/18/2019   PFIZER(Purple Top)SARS-COV-2 Vaccination 01/30/2020, 02/20/2020, 09/30/2020, 03/17/2021   Pneumococcal Conjugate-13 10/30/2017,  04/10/2019    TDAP status: Due, Education has been provided regarding the importance of this vaccine. Advised may receive this vaccine at local pharmacy or Health Dept. Aware to provide a copy of the vaccination record if obtained from local pharmacy or Health Dept. Verbalized acceptance and understanding.  Flu Vaccine status: Due, Education has been provided regarding the importance of this vaccine. Advised may receive this vaccine at local pharmacy or Health Dept. Aware to provide a copy of the vaccination record if obtained from local pharmacy or Health Dept. Verbalized acceptance and understanding.  Pneumococcal vaccine status: Up to date  Covid-19 vaccine status: Completed vaccines  Qualifies for Shingles Vaccine? Yes   Zostavax completed No   Shingrix Completed?: No.    Education has been provided regarding the importance of this vaccine. Patient has been advised to call insurance company to determine out of pocket expense if they have not yet received this vaccine. Advised may also receive vaccine at local pharmacy or Health Dept. Verbalized acceptance and understanding.  Screening Tests Health Maintenance  Topic Date Due   Zoster Vaccines- Shingrix (1 of 2) Never done   COVID-19 Vaccine (5 - Pfizer series) 05/12/2021   INFLUENZA VACCINE  07/17/2022   Pneumonia Vaccine 8+ Years old (2 - PPSV23 or PCV20) 06/02/2023 (Originally 06/05/2019)   MAMMOGRAM  06/02/2023 (Originally 02/09/1998)   Hepatitis C Screening  06/02/2023 (Originally 02/09/1964)   DEXA SCAN  Completed   HPV VACCINES  Aged Out   COLONOSCOPY (Pts 45-37yrs Insurance coverage will need to be confirmed)  Discontinued   TETANUS/TDAP  Discontinued    Health Maintenance  Health Maintenance Due  Topic Date Due   Zoster Vaccines- Shingrix (1 of 2) Never done   COVID-19 Vaccine (5 - Pfizer series) 05/12/2021  INFLUENZA VACCINE  07/17/2022    Colorectal Cancer Screening: Patient declines.  Mammogram Status: Patient  Declined Mammogram.  Lung Cancer Screening: (Low Dose CT Chest recommended if Age 57-80 years, 30 pack-year currently smoking OR have quit w/in 15years.) does qualify.   Lung Cancer Screening Referral: Patient Declines  Additional Screening:  Hepatitis C Screening: does qualify; Patient Declined.  Vision Screening: Recommended annual ophthalmology exams for early detection of glaucoma and other disorders of the eye. Is the patient up to date with their annual eye exam?  Yes  Who is the provider or what is the name of the office in which the patient attends annual eye exams? [facility]  If pt is not established with a provider, would they like to be referred to a provider to establish care?  N/A .   Dental Screening: Recommended annual dental exams for proper oral hygiene  Community Resource Referral / Chronic Care Management: CRR required this visit?  No   CCM required this visit?  No      Plan:     I have personally reviewed and noted the following in the patient's chart:   Medical and social history Use of alcohol, tobacco or illicit drugs  Current medications and supplements including opioid prescriptions. Patient is not currently taking opioid prescriptions. Functional ability and status Nutritional status Physical activity Advanced directives List of other physicians Hospitalizations, surgeries, and ER visits in previous 12 months Vitals Screenings to include cognitive, depression, and falls Referrals and appointments  In addition, I have reviewed and discussed with patient certain preventive protocols, quality metrics, and best practice recommendations. A written personalized care plan for preventive services as well as general preventive health recommendations were provided to patient.     Clista Bernhardt, [place]   09/26/2022   Nurse Notes: NONE.

## 2022-10-10 ENCOUNTER — Ambulatory Visit: Payer: Medicare Other

## 2022-11-23 ENCOUNTER — Ambulatory Visit: Payer: Self-pay

## 2022-11-23 NOTE — Telephone Encounter (Signed)
Noted  Pt going to UC.  KP

## 2022-11-23 NOTE — Telephone Encounter (Signed)
Message from Kandis Cocking sent at 11/23/2022  9:03 AM EST  Summary: Congestion. cough   The patient called in stating she has had bad congestion and a cough since the Sunday after Thanksgiving. She states her grandson has been sick with a virus and she thinks she may have picked it up from him. She has emphysema and copd and her provider does not have an appt opening until Tuesday. Please assist patient further         Chief Complaint: green nasal discharge Symptoms: cough with clear phlegm, mild SOB Frequency: 2 weeks ago with cough Pertinent Negatives: Patient denies fever, chest pain, wheezing Disposition: [] ED /[] Urgent Care (no appt availability in office) / [] Appointment(In office/virtual)/ []  [place] [facility]/ [] Home Care/ [x] Refused Recommended Disposition /[] [place] [facility]/ []  Follow-up with PCP Additional Notes: pt stated she will wait to go to UC. Pt wanted appt and appt made. Advised pt to go to UC or ED for worsening SOB, feels worse Pt unable to come to appts this afternoon. Called office and spoke to [place] and unable to fit pt in today.  Reason for Disposition  [1] Known COPD or other severe lung disease (i.e., bronchiectasis, cystic fibrosis, lung surgery) AND [2] worsening symptoms (i.e., increased sputum purulence or amount, increased breathing difficulty  Answer Assessment - Initial Assessment Questions 1. ONSET: "When did the cough begin?"      Sunday after Thanksgiving 2. SEVERITY: "How bad is the cough today?"      Coughing ocassionally 3. SPUTUM: "Describe the color of your sputum" (none, dry cough; clear, white, yellow, green)     Clear phlegm  4. HEMOPTYSIS: "Are you coughing up any blood?" If so ask: "How much?" (flecks, streaks, tablespoons, etc.)     no 5. DIFFICULTY BREATHING: "Are you having difficulty breathing?" If Yes, ask: "How bad is it?" (e.g., mild, moderate, severe)    - MILD: No SOB at rest, mild SOB with walking, speaks normally in  sentences, can lie down, no retractions, pulse < 100.    - MODERATE: SOB at rest, SOB with minimal exertion and prefers to sit, cannot lie down flat, speaks in phrases, mild retractions, audible wheezing, pulse 100-120.    - SEVERE: Very SOB at rest, speaks in single words, struggling to breathe, sitting hunched forward, retractions, pulse > 120      mild 6. FEVER: "Do you have a fever?" If Yes, ask: "What is your temperature, how was it measured, and when did it start?"     no 7. CARDIAC HISTORY: "Do you have any history of heart disease?" (e.g., heart attack, congestive heart failure)      n/a 8. LUNG HISTORY: "Do you have any history of lung disease?"  (e.g., pulmonary embolus, asthma, emphysema)     emphysema 9. PE RISK FACTORS: "Do you have a history of blood clots?" (or: recent major surgery, recent prolonged travel, bedridden)     N/a 10. OTHER SYMPTOMS: "Do you have any other symptoms?" (e.g., runny nose, wheezing, chest pain)       Green nasal discharge 11. PREGNANCY: "Is there any chance you are pregnant?" "When was your last menstrual period?"       N/a 12. TRAVEL: "Have you traveled out of the country in the last month?" (e.g., travel history, exposures)       N/a  Protocols used: Cough - Acute Productive-A-AH

## 2022-11-27 ENCOUNTER — Encounter: Payer: Self-pay | Admitting: Family Medicine

## 2022-11-27 ENCOUNTER — Ambulatory Visit (INDEPENDENT_AMBULATORY_CARE_PROVIDER_SITE_OTHER): Payer: Medicare Other | Admitting: Family Medicine

## 2022-11-27 VITALS — BP 120/72 | HR 76 | Temp 98.5°F | Ht 64.0 in | Wt 137.0 lb

## 2022-11-27 DIAGNOSIS — J432 Centrilobular emphysema: Secondary | ICD-10-CM | POA: Diagnosis not present

## 2022-11-27 DIAGNOSIS — J0100 Acute maxillary sinusitis, unspecified: Secondary | ICD-10-CM | POA: Diagnosis not present

## 2022-11-27 MED ORDER — FLUTICASONE-SALMETEROL 250-50 MCG/ACT IN AEPB: 1.0000 | INHALATION_SPRAY | Freq: Two times a day (BID) | RESPIRATORY_TRACT | 5 refills | Status: DC

## 2022-11-27 MED ORDER — AMOXICILLIN-POT CLAVULANATE 875-125 MG PO TABS: 1.0000 | ORAL_TABLET | Freq: Two times a day (BID) | ORAL | 0 refills | Status: DC

## 2022-11-27 MED ORDER — ALBUTEROL SULFATE HFA 108 (90 BASE) MCG/ACT IN AERS: 2.0000 | INHALATION_SPRAY | Freq: Four times a day (QID) | RESPIRATORY_TRACT | 1 refills | Status: DC | PRN

## 2022-11-27 NOTE — Progress Notes (Signed)
Date:  11/27/2022   Name:  [name]   DOB:  Jun 29, 1946   MRN:  [id number]   Chief Complaint: Sinusitis (X 3 weeks, Cough clear/ cloudy, unchanged, runny nose, nasal congestion, tried coricidin )  Sinusitis This is a new problem. The current episode started in the past 7 days. The problem is unchanged. There has been no fever. The pain is moderate. Associated symptoms include congestion, coughing and shortness of breath. Pertinent negatives include no chills, diaphoresis, ear pain, headaches, sinus pressure, sneezing or sore throat. Treatments tried: corcedin. The treatment provided mild relief.    No results found for: "NA", "K", "CO2", "GLUCOSE", "BUN", "CREATININE", "CALCIUM", "EGFR", "GFRNONAA" No results found for: "CHOL", "HDL", "LDLCALC", "LDLDIRECT", "TRIG", "CHOLHDL" No results found for: "TSH" No results found for: "HGBA1C" No results found for: "WBC", "HGB", "HCT", "MCV", "PLT" No results found for: "ALT", "AST", "GGT", "ALKPHOS", "BILITOT" No results found for: "25OHVITD2", "25OHVITD3", "VD25OH"   Review of Systems  Constitutional: Negative.  Negative for chills, diaphoresis, fatigue, fever and unexpected weight change.  HENT:  Positive for congestion. Negative for ear discharge, ear pain, rhinorrhea, sinus pressure, sneezing and sore throat.   Respiratory:  Positive for cough and shortness of breath. Negative for chest tightness, wheezing and stridor.   Cardiovascular:  Negative for chest pain and palpitations.  Gastrointestinal:  Positive for constipation. Negative for abdominal pain, blood in stool, diarrhea and nausea.  Genitourinary:  Negative for difficulty urinating and hematuria.  Musculoskeletal:  Negative for arthralgias, back pain and myalgias.  Skin:  Negative for rash.  Neurological:  Negative for dizziness, weakness and headaches.  Hematological:  Negative for adenopathy. Does not bruise/bleed easily.  Psychiatric/Behavioral:  Negative for  dysphoric mood. The patient is not nervous/anxious.     Patient Active Problem List   Diagnosis Date Noted   Centrilobular emphysema ([place]) 04/10/2019   Tobacco use 01/09/2016   Hypothyroidism 01/09/2016   Arthritis 01/09/2016    Allergies  Allergen Reactions   Shrimp Extract Allergy Skin Test Anaphylaxis    Has epipen   Sulfa Antibiotics     GI Bleeding    Past Surgical History:  Procedure Laterality Date   TONSILLECTOMY     TUBAL LIGATION      Social History   Tobacco Use   Smoking status: Every Day    Packs/day: 0.50    Years: 50.00    Total pack years: 25.00    Types: Cigarettes   Smokeless tobacco: Never  Vaping Use   Vaping Use: Never used  Substance Use Topics   Alcohol use: Yes    Alcohol/week: 1.0 standard drink of alcohol    Types: 1 Glasses of wine per week    Comment: 1-2 times weekly   Drug use: No     Medication list has been reviewed and updated.  Current Meds  Medication Sig   albuterol (VENTOLIN HFA) 108 (90 Base) MCG/ACT inhaler INHALE 2 PUFFS INTO THE LUNGS EVERY 6 HOURS AS NEEDED FOR WHEEZING OR SHORTNESS OF BREATH   alendronate (FOSAMAX) 70 MG tablet Take 1 tablet by mouth once a week. Dr Truddie Coco   Calcium Carb-Cholecalciferol (CALCIUM 600-D PO) Take by mouth.   cetirizine-pseudoephedrine (ZYRTEC-D) 5-120 MG tablet Take by mouth. PRN   fluticasone (FLONASE) 50 MCG/ACT nasal spray Place 2 sprays into both nostrils 2 (two) times daily.   Multiple Vitamin (MULTIVITAMIN) capsule Take 1 capsule by mouth daily.   thyroid (ARMOUR) 60 MG tablet Take 60 mg by mouth  daily before breakfast.   Vitamin D, Ergocalciferol, (DRISDOL) 50000 units CAPS capsule Take 50,000 Units by mouth every 7 (seven) days. Dr Theodosia Blender INHUB 250-50 MCG/ACT AEPB INHALE ONE PUFF TWICE DAILY       11/27/2022    3:32 PM 06/01/2022    9:32 AM 12/01/2021   10:25 AM 12/21/2019   10:20 AM  GAD 7 : Generalized Anxiety Score  Nervous, Anxious, on Edge 1 1 0 1   Control/stop worrying 1 1 0 0  Worry too much - different things 1 1 0 0  Trouble relaxing 2 1 0 0  Restless 0 0 0 1  Easily annoyed or irritable 2 1 0 1  Afraid - awful might happen 0 0 0 1  Total GAD 7 Score 7 5 0 4  Anxiety Difficulty Not difficult at all Not difficult at all  Not difficult at all       11/27/2022    3:30 PM 09/26/2022   10:55 AM 06/01/2022    9:32 AM  Depression screen PHQ 2/9  Decreased Interest 0 0 1  Down, Depressed, Hopeless 0 0 1  PHQ - 2 Score 0 0 2  Altered sleeping 2 0 1  Tired, decreased energy 2 0 1  Change in appetite 0 0 0  Feeling bad or failure about yourself  0 0 0  Trouble concentrating 0 0 0  Moving slowly or fidgety/restless 0 0 0  Suicidal thoughts 0 0 0  PHQ-9 Score 4 0 4  Difficult doing work/chores  Not difficult at all Not difficult at all    BP Readings from Last 3 Encounters:  11/27/22 120/72  06/01/22 100/60  12/01/21 130/70    Physical Exam Vitals and nursing note reviewed. Exam conducted with a chaperone present.  Constitutional:      General: She is not in acute distress.    Appearance: She is not diaphoretic.  HENT:     Head: Normocephalic and atraumatic.     Right Ear: External ear normal.     Left Ear: External ear normal.     Nose: Nose normal.  Eyes:     General:        Right eye: No discharge.        Left eye: No discharge.     Conjunctiva/sclera: Conjunctivae normal.     Pupils: Pupils are equal, round, and reactive to light.  Neck:     Thyroid: No thyromegaly.     Vascular: No JVD.  Cardiovascular:     Rate and Rhythm: Normal rate and regular rhythm.     Heart sounds: Normal heart sounds, S1 normal and S2 normal. No murmur heard.    No systolic murmur is present.     No diastolic murmur is present.     No friction rub. No gallop. No S3 or S4 sounds.  Pulmonary:     Effort: Pulmonary effort is normal.     Breath sounds: Normal breath sounds. Decreased air movement present. No decreased breath  sounds, wheezing, rhonchi or rales.  Abdominal:     General: Bowel sounds are normal.     Palpations: Abdomen is soft. There is no mass.     Tenderness: There is no abdominal tenderness. There is no guarding.  Musculoskeletal:        General: Normal range of motion.     Cervical back: Neck supple.     Right lower leg: No edema.     Left lower  leg: No edema.  Lymphadenopathy:     Cervical: No cervical adenopathy.  Skin:    General: Skin is warm and dry.  Neurological:     Mental Status: She is alert.     Wt Readings from Last 3 Encounters:  11/27/22 137 lb (62.1 kg)  06/01/22 132 lb (59.9 kg)  12/01/21 132 lb (59.9 kg)    BP 120/72   Pulse 76   Temp 98.5 F (36.9 C) (Oral)   Ht _0  (1.626 m)   Wt 137 lb (62.1 kg)   SpO2 96%   BMI 23.52 kg/m   Assessment and Plan:  1. Acute maxillary sinusitis, recurrence not specified New onset.  Persistent.  Upper respiratory congestion with productive nasal discharge.  Exam is also consistent with upper respiratory infection likely maxillary sinusitis.  Will treat with Augmentin 875 mg twice a day for 10 days - amoxicillin-clavulanate (AUGMENTIN) 875-125 MG tablet; Take 1 tablet by mouth 2 (two) times daily.  Dispense: 20 tablet; Refill: 0  2. Centrilobular emphysema (HCC) Chronic.  Persistent.  Stable.  Will continue with albuterol inhaler and Wixela inhaler twice a day. - albuterol (VENTOLIN HFA) 108 (90 Base) MCG/ACT inhaler; Inhale 2 puffs into the lungs every 6 (six) hours as needed for wheezing or shortness of breath.  Dispense: 18 g; Refill: 1 - fluticasone-salmeterol (WIXELA INHUB) 250-50 MCG/ACT AEPB; Inhale 1 puff into the lungs 2 (two) times daily.  Dispense: 60 each; Refill: 5    Otilio Miu, MD

## 2023-04-16 ENCOUNTER — Ambulatory Visit: Payer: Medicare Other

## 2023-04-16 ENCOUNTER — Ambulatory Visit
Admission: RE | Admit: 2023-04-16 | Discharge: 2023-04-16 | Disposition: A | Discharge status: Home / Self Care | Payer: Medicare Other | Source: Ambulatory Visit | Attending: Acute Care | Admitting: Acute Care

## 2023-04-16 DIAGNOSIS — F17210 Nicotine dependence, cigarettes, uncomplicated: Secondary | ICD-10-CM | POA: Diagnosis present

## 2023-04-16 DIAGNOSIS — Z87891 Personal history of nicotine dependence: Secondary | ICD-10-CM

## 2023-04-16 DIAGNOSIS — Z122 Encounter for screening for malignant neoplasm of respiratory organs: Secondary | ICD-10-CM | POA: Diagnosis present

## 2023-04-22 ENCOUNTER — Telehealth: Payer: Self-pay | Admitting: Acute Care

## 2023-04-22 NOTE — Telephone Encounter (Signed)
Spoke to [facility]iology and call report will go to Kandice Robinsons, NP.

## 2023-04-22 NOTE — Telephone Encounter (Signed)
Call report CT

## 2023-04-23 ENCOUNTER — Telehealth: Payer: Self-pay | Admitting: Acute Care

## 2023-04-23 NOTE — Telephone Encounter (Signed)
Left VM for patient to call about recent LDCT results.  Kandice Robinsons, NP would like patient to come in for OV with Dr. Tonia Brooms or Delton Coombes.  This week if possible.

## 2023-04-24 ENCOUNTER — Telehealth: Payer: Self-pay | Admitting: Acute Care

## 2023-04-24 NOTE — Telephone Encounter (Signed)
I have attempted to call the patient with the results of their  Low Dose CT Chest Lung cancer screening scan. There was no answer. I have left a HIPPA compliant VM requesting the patient call the office for the scan results. I included the office contact information in the message. We will await his return call. If no return call we will continue to call until patient is contacted.    This patient will need to be seen in the [place] office. She actually lives in [place]. I will reach out to [employer].   If she does not call back today we will need to send a letter tomorrow am. She needs a PET scan.  I looked in demographics to see if there was someone else I could call, but her husband is the only person listed and it is the same number we have been calling.

## 2023-04-24 NOTE — Telephone Encounter (Signed)
See other telephone note from 04/24/2023

## 2023-04-25 ENCOUNTER — Telehealth: Payer: Self-pay | Admitting: Acute Care

## 2023-04-25 DIAGNOSIS — R911 Solitary pulmonary nodule: Secondary | ICD-10-CM

## 2023-04-25 NOTE — Telephone Encounter (Signed)
I have attempted to call the patient with the results of their  Low Dose CT Chest Lung cancer screening scan. There was no answer. I have left a HIPPA compliant VM requesting the patient call the office for the scan results. I included the office contact information in the message. We will await his return call. If no return call we will continue to call until patient is contacted.    This is the 4th call to the patient with no response back. We need to send a letter as she has a 4 B can and needs a PET. Thanks so much

## 2023-04-26 NOTE — Telephone Encounter (Signed)
Called and spoke with pt and explained that there is a larger sized lung nodule seen on her scan that we need to take a closer look at with a PET scan. Also advised that we will schedule pt to see one of our [place] docs to review the PET once it as been done. PT verbalized understanding and is aware she will receive a call regarding PET scan appt and Pulmonary appt. Order placed for PET. Results/ plans faxed to PCP.

## 2023-04-26 NOTE — Telephone Encounter (Signed)
See telephone note from 04/25/23

## 2023-05-07 ENCOUNTER — Ambulatory Visit
Admission: RE | Admit: 2023-05-07 | Discharge: 2023-05-07 | Disposition: A | Discharge status: Home / Self Care | Payer: Medicare Other | Source: Ambulatory Visit | Attending: Acute Care | Admitting: Acute Care

## 2023-05-07 DIAGNOSIS — J984 Other disorders of lung: Secondary | ICD-10-CM | POA: Insufficient documentation

## 2023-05-07 DIAGNOSIS — R911 Solitary pulmonary nodule: Secondary | ICD-10-CM | POA: Insufficient documentation

## 2023-05-07 LAB — GLUCOSE, CAPILLARY: Glucose-Capillary: 98 mg/dL (ref 70–99)

## 2023-05-07 MED ORDER — FLUDEOXYGLUCOSE F - 18 (FDG) INJECTION
7.8500 | Freq: Once | INTRAVENOUS | Status: AC | PRN
  Administered 2023-05-07: 7.85 via INTRAVENOUS

## 2023-05-21 ENCOUNTER — Encounter: Payer: Self-pay | Admitting: Student in an Organized Health Care Education/Training Program

## 2023-05-21 ENCOUNTER — Ambulatory Visit (INDEPENDENT_AMBULATORY_CARE_PROVIDER_SITE_OTHER): Payer: Medicare Other | Admitting: Student in an Organized Health Care Education/Training Program

## 2023-05-21 VITALS — BP 118/68 | HR 67 | Temp 97.8°F | Ht 61.5 in | Wt 136.8 lb

## 2023-05-21 DIAGNOSIS — F17210 Nicotine dependence, cigarettes, uncomplicated: Secondary | ICD-10-CM | POA: Diagnosis not present

## 2023-05-21 DIAGNOSIS — R911 Solitary pulmonary nodule: Secondary | ICD-10-CM | POA: Diagnosis not present

## 2023-05-22 NOTE — Progress Notes (Unsigned)
Synopsis: Referred in for pulmonary nodule by Duanne Limerick, MD  Assessment & Plan:   1. Lung nodule  Nodule Location: LLL, RUL Nodule Size: 13 mm, 14 mm Nodule Spiculation: No Associated Lymphadenopathy: No Smoking Status (current) and pack years: 50 Extrathoracic cancer > 5 years prior (no) ECOG: 0  The patient is here to discuss their imaging abnormalities which include a ground glass nodule in the LLL as well as an area of soft tissue attenuation and possible scar in the RUL.  We discussed the importance of diagnosis and staging in lung malignancies, and the approach to obtaining a tissue diagnosis which would include robotic assisted navigational bronchoscopy with endobronchial ultrasound guided sampling.  We also discussed the risks associated with the procedure which include a 2% risk of pneumothorax, infection, bleeding, and nondiagnostic procedure in detail.  I explained that patients typically are able to return home the same day of the procedure, but in rare cases admission to the hospital for observation and treatment is required.  After our discussion, the patient elected to {Black single:19197::"proceed with the procedure","defer procedure"}  Recommendations: ***   2. Cigarette smoker  Smoking cessation counseling given. Patient is not currently ready nor willing to attempt cessation. Will revisit this on follow up given evidence showing increased likelihood of cessation with continued counseling.   No follow-ups on file.  I spent *** minutes caring for this patient today, including {EM billing:28027}  Raechel Chute, MD [facility] 05/22/2023 9:45 PM    End of visit medications:  No orders of the defined types were placed in this encounter.    Current Outpatient Medications:    albuterol (VENTOLIN HFA) 108 (90 Base) MCG/ACT inhaler, Inhale 2 puffs into the lungs every 6 (six) hours as needed for wheezing or shortness of breath., Disp:  18 g, Rfl: 1   Calcium Carb-Cholecalciferol (CALCIUM 600-D PO), Take by mouth., Disp: , Rfl:    cetirizine-pseudoephedrine (ZYRTEC-D) 5-120 MG tablet, Take by mouth. PRN, Disp: , Rfl:    fluticasone (FLONASE) 50 MCG/ACT nasal spray, Place 2 sprays into both nostrils 2 (two) times daily., Disp: 16 g, Rfl: 11   fluticasone-salmeterol (WIXELA INHUB) 250-50 MCG/ACT AEPB, Inhale 1 puff into the lungs 2 (two) times daily., Disp: 60 each, Rfl: 5   Multiple Vitamin (MULTIVITAMIN) capsule, Take 1 capsule by mouth daily., Disp: , Rfl:    thyroid (ARMOUR) 60 MG tablet, Take 60 mg by mouth daily before breakfast., Disp: , Rfl:    Vitamin D, Ergocalciferol, (DRISDOL) 50000 units CAPS capsule, Take 50,000 Units by mouth every 7 (seven) days. Dr Nelia Shi, Disp: , Rfl:    Subjective:   PATIENT ID: [name] GENDER: female DOB: Nov 21, 1946, MRN: [id number]  Chief Complaint  Patient presents with   pulmonary consult    PET 05/10/23-SOB with exertion, prod cough with clear sputum and occ wheezing at night.     HPI  Patient is a pleasant 77 year old female presenting for the evaluation of pulmonary nodule noted on low dose CT for lung cancer screening.  Patient was enrolled in our lung cancer screening program last year, with initial CT performed 03/2022. Follow up CT 04/2023 showed a ground glass LLL nodule measuring 17 mm, similar to the year prior. An area of soft tissue density within the anterior right apex and upper lobe was noted to be new, measuring 17 mm in diameter, concerning for malignancy. She was referred to pulmonology for evaluation.  This was followed  by a PET/CT performed 05/07/2023 which did not show areas of PET avidity in the lung. The PET reports notes the RUL lesion to likely be a scar, with more weight and concern given to the LLL ground glass opacity. There was also an 8 mm short axis portacaval node that showed increased FDG avidity.  Patient is in her usual state of health, and  has no complaints. 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 She presented to clinic in the company of her husband and daughter. There is no chest pain, chest tightness, wheezing, shortness of breath, or cough. Patient has a long standing history of smoking, with around 50 pack years of smoking history. She has no interest in smoking cessation. Patient and husband report having plans of travelling to [place] late June/Early July.  Ancillary information including prior medications, full medical/surgical/family/social histories, and PFTs (when available) are listed below and have been reviewed.   Review of Systems  Constitutional:  Negative for chills, fever, malaise/fatigue and weight loss.  Respiratory:  Negative for cough, hemoptysis, sputum production, shortness of breath and wheezing.   Cardiovascular:  Negative for chest pain and palpitations.     Objective:   Vitals:   05/21/23 1531  BP: 118/68  Pulse: 67  Temp: 97.8 F (36.6 C)  TempSrc: Temporal  SpO2: 96%  Weight: 136 lb 12.8 oz (62.1 kg)  Height: 5' 1.5" (1.562 m)   96% on RA BMI Readings from Last 3 Encounters:  05/21/23 25.43 kg/m  11/27/22 23.52 kg/m  09/26/22 22.66 kg/m   Wt Readings from Last 3 Encounters:  05/21/23 136 lb 12.8 oz (62.1 kg)  11/27/22 137 lb (62.1 kg)  06/01/22 132 lb (59.9 kg)    Physical Exam Constitutional:      Appearance: Normal appearance.  HENT:     Head: Atraumatic.     Mouth/Throat:     Mouth: Mucous membranes are moist.  Cardiovascular:     Rate and Rhythm: Normal rate and regular rhythm.     Pulses: Normal pulses.     Heart sounds: Normal heart sounds.  Pulmonary:     Effort: Pulmonary effort is normal.     Breath sounds: Normal breath sounds. No wheezing or rales.  Abdominal:     Palpations: Abdomen is soft.  Musculoskeletal:     Right lower leg: No edema.     Left lower leg: No edema.  Neurological:     General: No focal deficit present.      Mental Status: She is alert and oriented to person, place, and time. Mental status is at baseline.     Ancillary Information    Past Medical History:  Diagnosis Date   Aortic atherosclerosis (HCC)    Emphysema lung (HCC)    Osteoarthritis    Osteoporosis    Thyroid disease      No family history on file.   Past Surgical History:  Procedure Laterality Date   TONSILLECTOMY     TUBAL LIGATION      Social History   Socioeconomic History   Marital status: Married    Spouse name: Not on file   Number of children: 3   Years of education: Not on file   Highest education level: Not on file  Occupational History   Not on file  Tobacco Use   Smoking status: Every Day    Packs/day: 0.75    Years: 56.00    Additional pack years: 0.00    Total  pack years: 42.00    Types: Cigarettes   Smokeless tobacco: Never  Vaping Use   Vaping Use: Never used  Substance and Sexual Activity   Alcohol use: Yes    Alcohol/week: 1.0 standard drink of alcohol    Types: 1 Glasses of wine per week    Comment: 1-2 times weekly   Drug use: No   Sexual activity: Not Currently  Other Topics Concern   Not on file  Social History Narrative   Not on file   Social Determinants of Health   Financial Resource Strain: Low Risk  (11/27/2022)   Overall Financial Resource Strain (CARDIA)    Difficulty of Paying Living Expenses: Not hard at all  Food Insecurity: No Food Insecurity (11/27/2022)   Hunger Vital Sign    Worried About Running Out of Food in the Last Year: Never true    Ran Out of Food in the Last Year: Never true  Transportation Needs: No Transportation Needs (11/27/2022)   PRAPARE - Administrator, Civil Service (Medical): No    Lack of Transportation (Non-Medical): No  Physical Activity: Inactive (09/26/2022)   Exercise Vital Sign    Days of Exercise per Week: 0 days    Minutes of Exercise per Session: 0 min  Stress: No Stress Concern Present (08/02/2021)   [employer] of Occupational Health - Occupational Stress Questionnaire    Feeling of Stress : Only a little  Social Connections: Moderately Isolated (09/26/2022)   Social Connection and Isolation Panel [NHANES]    Frequency of Communication with Friends and Family: More than three times a week    Frequency of Social Gatherings with Friends and Family: Three times a week    Attends Religious Services: Never    Active Member of Clubs or Organizations: No    Attends Banker Meetings: Never    Marital Status: Married  Catering manager Violence: Not At Risk (11/27/2022)   Humiliation, Afraid, Rape, and Kick questionnaire    Fear of Current or Ex-Partner: No    Emotionally Abused: No    Physically Abused: No    Sexually Abused: No     Allergies  Allergen Reactions   Shrimp Extract Anaphylaxis    Has epipen   Sulfa Antibiotics     GI Bleeding     CBC No results found for: "WBC", "RBC", "HGB", "HCT", "PLT", "MCV", "MCH", "MCHC", "RDW", "LYMPHSABS", "MONOABS", "EOSABS", "BASOSABS"  Pulmonary Functions Testing Results:     No data to display          Outpatient Medications Prior to Visit  Medication Sig Dispense Refill   albuterol (VENTOLIN HFA) 108 (90 Base) MCG/ACT inhaler Inhale 2 puffs into the lungs every 6 (six) hours as needed for wheezing or shortness of breath. 18 g 1   Calcium Carb-Cholecalciferol (CALCIUM 600-D PO) Take by mouth.     cetirizine-pseudoephedrine (ZYRTEC-D) 5-120 MG tablet Take by mouth. PRN     fluticasone (FLONASE) 50 MCG/ACT nasal spray Place 2 sprays into both nostrils 2 (two) times daily. 16 g 11   fluticasone-salmeterol (WIXELA INHUB) 250-50 MCG/ACT AEPB Inhale 1 puff into the lungs 2 (two) times daily. 60 each 5   Multiple Vitamin (MULTIVITAMIN) capsule Take 1 capsule by mouth daily.     thyroid (ARMOUR) 60 MG tablet Take 60 mg by mouth daily before breakfast.     Vitamin D, Ergocalciferol, (DRISDOL) 50000 units CAPS capsule Take 50,000  Units by mouth every 7 (seven) days. Dr  Morayarti     amoxicillin-clavulanate (AUGMENTIN) 875-125 MG tablet Take 1 tablet by mouth 2 (two) times daily. 20 tablet 0   alendronate (FOSAMAX) 70 MG tablet Take 1 tablet by mouth once a week. Dr Nelia Shi (Patient not taking: Reported on 05/21/2023)     No facility-administered medications prior to visit.

## 2023-06-04 ENCOUNTER — Ambulatory Visit: Payer: Self-pay

## 2023-06-04 ENCOUNTER — Ambulatory Visit
Admission: EM | Admit: 2023-06-04 | Discharge: 2023-06-04 | Disposition: A | Discharge status: Home / Self Care | Payer: Medicare Other | Attending: Physician Assistant | Admitting: Physician Assistant

## 2023-06-04 DIAGNOSIS — S30860A Insect bite (nonvenomous) of lower back and pelvis, initial encounter: Secondary | ICD-10-CM

## 2023-06-04 DIAGNOSIS — W57XXXA Bitten or stung by nonvenomous insect and other nonvenomous arthropods, initial encounter: Secondary | ICD-10-CM

## 2023-06-04 DIAGNOSIS — R21 Rash and other nonspecific skin eruption: Secondary | ICD-10-CM

## 2023-06-04 MED ORDER — DOXYCYCLINE HYCLATE 100 MG PO CAPS: 100.0000 mg | ORAL_CAPSULE | Freq: Two times a day (BID) | ORAL | 0 refills | Status: AC

## 2023-06-04 NOTE — ED Triage Notes (Signed)
Pt here for a tick bite. Pt's family member pulled tick off of lower back 10 days ago and since pt has had a rash that's spreading. Rash is spreading down back and both legs. She thinks she has [place] fever. Pt denies fever

## 2023-06-04 NOTE — Telephone Encounter (Signed)
  Chief Complaint: Tick bite - red spots at that area and others Symptoms: Red spots developing on body Frequency: Tick removed 05/27/2023 Pertinent Negatives: Patient denies fever Disposition: [] ED /[x] Urgent Care (no appt availability in office) / [] Appointment(In office/virtual)/ []  [facility]/ [] Home Care/ [] Refused Recommended Disposition /[] [facility]/ []  Follow-up with PCP Additional Notes: Pt states that her son found a very tiny tick on her and removed it. Pt now has a red & yellow spot where the tic was and other areas of her body as well. Pt does not want to wait for an appt tomorrow. Pt will go to UC today.   Reason for Disposition  Red ring or bull's-eye rash occurs at tick bite  Answer Assessment - Initial Assessment Questions 1. ATTACHED:  "Is the tick still on the skin?"  (e.g., yes, no, unsure)     no 2. ONSET - TICK STILL ATTACHED:  "How long do you think the tick has been on your skin?" (e.g., hours, days, unsure)  Note:  Is there a recent activity (camping, hiking) where the caller may have been exposed?     No -  - removed 6/10 3. ONSET - TICK NOT STILL ATTACHED: "If the tick has been removed, how long do you think the tick was attached before you removed it?" (e.g., 5 hours, 2 days). "When was this?"     Unsure was fully attached 4. LOCATION: "Where is the tick bite located?" (e.g., arm, leg)     leg 5. TYPE of TICK: "Is it a wood tick or a deer tick?" (e.g., deer tick, wood tick; unsure)     Very small 6. SIZE of TICK: "How big is the tick?" (e.g., size of poppy seed, apple seed, watermelon seed; unsure) Note: Deer ticks can be the size of a poppy seed (nymph) or an apple seed (adult).       Very small 7. ENGORGED: "Did the tick look flat or engorged (full, swollen)?" (e.g., flat, engorged; unsure)     Engorged 8. OTHER SYMPTOMS: "Do you have any other symptoms?" (e.g., fever, rash, redness at bite area, red ring around bite)     Redness at  area, pt is developing additional red spots  Protocols used: Tick Bite-A-AH

## 2023-06-04 NOTE — ED Provider Notes (Signed)
[facility]    CSN: [id number] Arrival date & time: 06/04/23  1504      History   Chief Complaint Chief Complaint  Patient presents with   Tick Removal    HPI [name] is a 77 y.o. female presenting for rash of the body that started about 2 days ago.  She reports she pulled a tick off her lower back about a week to week and a half ago.  States now she has red bumps popping up on her legs and a couple spots on her abdomen and lower back.  She says the site the tick bit her at has pretty much healed up.  She says the bumps are a little bit itchy but they do not cause her any pain.  She does not think she has had any fevers but does report that she takes Aleve pretty much every day for pain.  She denies fatigue, body aches, chills, flulike symptoms, chest pain, shortness of breath.  She is concerned about possible tickborne illness.  Reports she reached out to her PCP and they advised that she should be seen in person for an appointment or urgent care.  HPI  Past Medical History:  Diagnosis Date   Aortic atherosclerosis (HCC)    Emphysema lung (HCC)    Osteoarthritis    Osteoporosis    Thyroid disease     Patient Active Problem List   Diagnosis Date Noted   Centrilobular emphysema (HCC) 04/10/2019   Tobacco use 01/09/2016   Hypothyroidism 01/09/2016   Arthritis 01/09/2016    Past Surgical History:  Procedure Laterality Date   THYROIDECTOMY, PARTIAL     TONSILLECTOMY     TUBAL LIGATION      OB History   No obstetric history on file.      Home Medications    Prior to Admission medications   Medication Sig Start Date End Date Taking? Authorizing Provider  albuterol (VENTOLIN HFA) 108 (90 Base) MCG/ACT inhaler Inhale 2 puffs into the lungs every 6 (six) hours as needed for wheezing or shortness of breath. 11/27/22  Yes Duanne Limerick, MD  Calcium Carb-Cholecalciferol (CALCIUM 600-D PO) Take by mouth.   Yes [provider]   cetirizine-pseudoephedrine (ZYRTEC-D) 5-120 MG tablet Take by mouth. PRN   Yes [provider]  doxycycline (VIBRAMYCIN) 100 MG capsule Take 1 capsule (100 mg total) by mouth 2 (two) times daily for 7 days. 06/04/23 06/11/23 Yes Eusebio Friendly B, PA-C  fluticasone (FLONASE) 50 MCG/ACT nasal spray Place 2 sprays into both nostrils 2 (two) times daily. 12/21/19  Yes Duanne Limerick, MD  fluticasone-salmeterol (WIXELA INHUB) 250-50 MCG/ACT AEPB Inhale 1 puff into the lungs 2 (two) times daily. 11/27/22  Yes Duanne Limerick, MD  Multiple Vitamin (MULTIVITAMIN) capsule Take 1 capsule by mouth daily.   Yes [provider]  thyroid (ARMOUR) 60 MG tablet Take 60 mg by mouth daily before breakfast.   Yes [provider]  Vitamin D, Ergocalciferol, (DRISDOL) 50000 units CAPS capsule Take 50,000 Units by mouth every 7 (seven) days. Dr Nelia Shi   Yes [provider]    Family History History reviewed. No pertinent family history.  Social History Social History   Tobacco Use   Smoking status: Every Day    Packs/day: 0.75    Years: 56.00    Additional pack years: 0.00    Total pack years: 42.00    Types: Cigarettes   Smokeless tobacco: Never  Vaping Use  Vaping Use: Never used  Substance Use Topics   Alcohol use: Yes    Alcohol/week: 1.0 standard drink of alcohol    Types: 1 Glasses of wine per week    Comment: 1-2 times weekly   Drug use: No     Allergies   Shrimp extract and Sulfa antibiotics   Review of Systems Review of Systems  Constitutional:  Negative for fatigue and fever.  Respiratory:  Negative for shortness of breath.   Cardiovascular:  Negative for chest pain.  Musculoskeletal:  Negative for arthralgias, joint swelling and myalgias.  Skin:  Positive for rash.  Neurological:  Negative for dizziness and headaches.     Physical Exam Triage Vital Signs ED Triage Vitals  Enc Vitals Group     BP      Pulse      Resp      Temp       Temp src      SpO2      Weight      Height      Head Circumference      Peak Flow      Pain Score      Pain Loc      Pain Edu?      Excl. in GC?    No data found.  Updated Vital Signs BP 111/82   Pulse 83   Temp 98.8 F (37.1 C)   Resp 20   SpO2 97%     Physical Exam Vitals and nursing note reviewed.  Constitutional:      General: She is not in acute distress.    Appearance: Normal appearance. She is not ill-appearing or toxic-appearing.  HENT:     Head: Normocephalic and atraumatic.     Nose: Nose normal.     Mouth/Throat:     Mouth: Mucous membranes are moist.     Pharynx: Oropharynx is clear.  Eyes:     General: No scleral icterus.       Right eye: No discharge.        Left eye: No discharge.     Conjunctiva/sclera: Conjunctivae normal.  Cardiovascular:     Rate and Rhythm: Normal rate and regular rhythm.     Heart sounds: Normal heart sounds.  Pulmonary:     Effort: Pulmonary effort is normal. No respiratory distress.     Breath sounds: Normal breath sounds.  Musculoskeletal:     Cervical back: Neck supple.  Skin:    General: Skin is dry.     Findings: Rash present.     Comments: Healed lesion of left lower back (initial tick bite site). Multiple scattered erythematous papules of low back, abdomen and bilateral legs.  See images.  Neurological:     General: No focal deficit present.     Mental Status: She is alert. Mental status is at baseline.     Motor: No weakness.     Gait: Gait normal.  Psychiatric:        Mood and Affect: Mood normal.        Behavior: Behavior normal.        Thought Content: Thought content normal.         UC Treatments / Results  Labs (all labs ordered are listed, but only abnormal results are displayed) Labs Reviewed - No data to display  EKG   Radiology No results found.  Procedures Procedures (including critical care time)  Medications Ordered in UC Medications - No data to display  Initial Impression /  Assessment and Plan / UC Course  I have reviewed the triage vital signs and the nursing notes.  Pertinent labs & imaging results that were available during my care of the patient were reviewed by me and considered in my medical decision making (see chart for details).   77 year old female presents for erythematous papular rash on body that started a couple days ago.  Reports tick bite about a week to a week and a half ago.  Thinks it could be related and she is concerned about tick borne illness.  Has been taking daily Aleve so she has not noticed a fever.  Denies fatigue or bodyaches, flulike symptoms, chest pain or shortness of breath.  I have included images in the chart of patient's rash which could be consistent with rickettsial disease.  I do not believe she would test positive at this time but did offer her serology.  She reports she will follow-up with her PCP for serology.  Will start patient on doxycycline for 1 week.  Advised follow-up with PCP in the next couple of weeks for serology.  Advised of return and ER precautions.   Final Clinical Impressions(s) / UC Diagnoses   Final diagnoses:  Rash and nonspecific skin eruption  Tick bite of lower back, initial encounter     Discharge Instructions      -Concern for tick disease. - I sent doxycycline to the pharmacy. - Reach out to your PCP for an appointment.  You likely would not test positive and a blood test at this time.  It generally takes a couple of weeks at minimum to be positive.  Try to get an appointment in the next couple weeks to have your blood checked. - If you feel that your symptoms are worsening or you develop a fever, chest pain, shortness of breath, weakness, vomiting, abdominal pain, dizziness, please go to ER.     ED Prescriptions     Medication Sig Dispense Auth. Provider   doxycycline (VIBRAMYCIN) 100 MG capsule Take 1 capsule (100 mg total) by mouth 2 (two) times daily for 7 days. 14 capsule Shirlee Latch, PA-C      PDMP not reviewed this encounter.   Shirlee Latch, PA-C 06/04/23 1616

## 2023-06-04 NOTE — Discharge Instructions (Addendum)
-  Concern for tick disease. - I sent doxycycline to the pharmacy. - Reach out to your PCP for an appointment.  You likely would not test positive and a blood test at this time.  It generally takes a couple of weeks at minimum to be positive.  Try to get an appointment in the next couple weeks to have your blood checked. - If you feel that your symptoms are worsening or you develop a fever, chest pain, shortness of breath, weakness, vomiting, abdominal pain, dizziness, please go to ER.

## 2023-06-04 NOTE — Telephone Encounter (Signed)
Noted  Pt advise to go to UC.  KP

## 2023-06-05 ENCOUNTER — Ambulatory Visit: Payer: Medicare Other | Admitting: Physician Assistant

## 2023-06-06 ENCOUNTER — Other Ambulatory Visit: Payer: Self-pay | Admitting: Student in an Organized Health Care Education/Training Program

## 2023-06-06 DIAGNOSIS — R911 Solitary pulmonary nodule: Secondary | ICD-10-CM | POA: Insufficient documentation

## 2023-06-07 ENCOUNTER — Telehealth: Payer: Self-pay

## 2023-06-07 NOTE — Telephone Encounter (Addendum)
Received below message from Dr. Aundria Rud via epic secure chat:  patient's robotic bronch scheduled for 7/23 in [place]   Per Vara Guardian, she spoke to patient and patient requested that she call back 06/10/2023, as she is currently away from her calendar.

## 2023-06-10 ENCOUNTER — Encounter: Payer: Self-pay | Admitting: Student in an Organized Health Care Education/Training Program

## 2023-06-14 NOTE — Telephone Encounter (Signed)
Spoke to patient. She is aware of scheduled bronchoscopy date/time and location. Nothing further needed.

## 2023-07-04 ENCOUNTER — Other Ambulatory Visit: Payer: Self-pay | Admitting: Acute Care

## 2023-07-05 ENCOUNTER — Ambulatory Visit (INDEPENDENT_AMBULATORY_CARE_PROVIDER_SITE_OTHER): Payer: Medicare Other | Admitting: Family Medicine

## 2023-07-05 ENCOUNTER — Encounter: Payer: Self-pay | Admitting: Family Medicine

## 2023-07-05 VITALS — BP 100/60 | HR 66 | Ht 64.0 in | Wt 135.0 lb

## 2023-07-05 DIAGNOSIS — R233 Spontaneous ecchymoses: Secondary | ICD-10-CM | POA: Diagnosis not present

## 2023-07-05 DIAGNOSIS — W57XXXA Bitten or stung by nonvenomous insect and other nonvenomous arthropods, initial encounter: Secondary | ICD-10-CM

## 2023-07-05 DIAGNOSIS — S30860A Insect bite (nonvenomous) of lower back and pelvis, initial encounter: Secondary | ICD-10-CM

## 2023-07-05 NOTE — Progress Notes (Signed)
Date:  07/05/2023   Name:  [name]   DOB:  07-Nov-1946   MRN:  [id number]   Chief Complaint: Labs Only Micah Flesher to UC x 2 weeks ago. Was put on doxy- told to get labs drawn after completion of antibiotics)  HPI  No results found for: "NA", "K", "CO2", "GLUCOSE", "BUN", "CREATININE", "CALCIUM", "EGFR", "GFRNONAA" No results found for: "CHOL", "HDL", "LDLCALC", "LDLDIRECT", "TRIG", "CHOLHDL" No results found for: "TSH" No results found for: "HGBA1C" No results found for: "WBC", "HGB", "HCT", "MCV", "PLT" No results found for: "ALT", "AST", "GGT", "ALKPHOS", "BILITOT" No results found for: "25OHVITD2", "25OHVITD3", "VD25OH"   Review of Systems  Patient Active Problem List   Diagnosis Date Noted   Lung nodule 06/06/2023   Centrilobular emphysema (HCC) 04/10/2019   Tobacco use 01/09/2016   Hypothyroidism 01/09/2016   Arthritis 01/09/2016    Allergies  Allergen Reactions   Shrimp Extract Anaphylaxis    Has epipen   Sulfa Antibiotics     GI Bleeding    Past Surgical History:  Procedure Laterality Date   THYROIDECTOMY, PARTIAL     TONSILLECTOMY     TUBAL LIGATION      Social History   Tobacco Use   Smoking status: Every Day    Current packs/day: 0.75    Average packs/day: 0.8 packs/day for 56.0 years (42.0 ttl pk-yrs)    Types: Cigarettes   Smokeless tobacco: Never  Vaping Use   Vaping status: Never Used  Substance Use Topics   Alcohol use: Yes    Alcohol/week: 1.0 standard drink of alcohol    Types: 1 Glasses of wine per week    Comment: 1-2 times weekly   Drug use: No     Medication list has been reviewed and updated.  No outpatient medications have been marked as taking for the 07/05/23 encounter (Office Visit) with Duanne Limerick, MD.       07/05/2023   10:11 AM 11/27/2022    3:32 PM 06/01/2022    9:32 AM 12/01/2021   10:25 AM  GAD 7 : Generalized Anxiety Score  Nervous, Anxious, on Edge 0 1 1 0  Control/stop worrying 0 1 1 0  Worry too  much - different things 0 1 1 0  Trouble relaxing 0 2 1 0  Restless 0 0 0 0  Easily annoyed or irritable 0 2 1 0  Afraid - awful might happen 0 0 0 0  Total GAD 7 Score 0 7 5 0  Anxiety Difficulty Not difficult at all Not difficult at all Not difficult at all        07/05/2023   10:10 AM 11/27/2022    3:30 PM 09/26/2022   10:55 AM  Depression screen PHQ 2/9  Decreased Interest 0 0 0  Down, Depressed, Hopeless 1 0 0  PHQ - 2 Score 1 0 0  Altered sleeping 0 2 0  Tired, decreased energy 0 2 0  Change in appetite 0 0 0  Feeling bad or failure about yourself  0 0 0  Trouble concentrating 0 0 0  Moving slowly or fidgety/restless 0 0 0  Suicidal thoughts 0 0 0  PHQ-9 Score 1 4 0  Difficult doing work/chores Not difficult at all  Not difficult at all    BP Readings from Last 3 Encounters:  07/05/23 100/60  06/04/23 111/82  05/21/23 118/68    Physical Exam  Wt Readings from Last 3 Encounters:  07/05/23 135 lb (61.2 kg)  05/21/23 136 lb 12.8 oz (62.1 kg)  11/27/22 137 lb (62.1 kg)    BP 100/60   Pulse 66   Ht 5\' 4"  (1.626 m)   Wt 135 lb (61.2 kg)   SpO2 96%   BMI 23.17 kg/m   Assessment and Plan: 1. Tick bite of lower back, initial encounter See written note. - Lyme Disease Abs IgG, IgM, IFA, CSF - Lyme Disease Serology w/Reflex - Spotted Fever Group Antibodies - CBC with Differential/Platelet  2. Petechiae or ecchymoses  - Lyme Disease Serology w/Reflex - Spotted Fever Group Antibodies - CBC with Differential/Platelet     Elizabeth Sauer, MD

## 2023-07-08 ENCOUNTER — Ambulatory Visit
Admission: RE | Admit: 2023-07-08 | Discharge: 2023-07-08 | Disposition: A | Discharge status: Home / Self Care | Payer: Medicare Other | Source: Ambulatory Visit | Attending: Student in an Organized Health Care Education/Training Program | Admitting: Student in an Organized Health Care Education/Training Program

## 2023-07-08 ENCOUNTER — Other Ambulatory Visit
Admission: RE | Admit: 2023-07-08 | Discharge: 2023-07-08 | Disposition: A | Discharge status: Home / Self Care | Payer: Medicare Other | Source: Home / Self Care | Attending: Family Medicine | Admitting: Family Medicine

## 2023-07-08 ENCOUNTER — Other Ambulatory Visit: Payer: Self-pay

## 2023-07-08 ENCOUNTER — Encounter (HOSPITAL_COMMUNITY): Payer: Self-pay | Admitting: Student in an Organized Health Care Education/Training Program

## 2023-07-08 DIAGNOSIS — R233 Spontaneous ecchymoses: Secondary | ICD-10-CM | POA: Insufficient documentation

## 2023-07-08 DIAGNOSIS — R911 Solitary pulmonary nodule: Secondary | ICD-10-CM | POA: Diagnosis present

## 2023-07-08 DIAGNOSIS — W57XXXA Bitten or stung by nonvenomous insect and other nonvenomous arthropods, initial encounter: Secondary | ICD-10-CM | POA: Insufficient documentation

## 2023-07-08 DIAGNOSIS — S30860A Insect bite (nonvenomous) of lower back and pelvis, initial encounter: Secondary | ICD-10-CM | POA: Insufficient documentation

## 2023-07-08 LAB — CBC WITH DIFFERENTIAL/PLATELET
Abs Immature Granulocytes: 0.01 10*3/uL (ref 0.00–0.07)
Basophils Absolute: 0 10*3/uL (ref 0.0–0.1)
Basophils Relative: 0 %
Eosinophils Absolute: 0.1 10*3/uL (ref 0.0–0.5)
Eosinophils Relative: 3 %
HCT: 37.8 % (ref 36.0–46.0)
Hemoglobin: 12.6 g/dL (ref 12.0–15.0)
Immature Granulocytes: 0 %
Lymphocytes Relative: 41 %
Lymphs Abs: 1.8 10*3/uL (ref 0.7–4.0)
MCH: 31.2 pg (ref 26.0–34.0)
MCHC: 33.3 g/dL (ref 30.0–36.0)
MCV: 93.6 fL (ref 80.0–100.0)
Monocytes Absolute: 0.4 10*3/uL (ref 0.1–1.0)
Monocytes Relative: 10 %
Neutro Abs: 1.9 10*3/uL (ref 1.7–7.7)
Neutrophils Relative %: 46 %
Platelets: 206 10*3/uL (ref 150–400)
RBC: 4.04 MIL/uL (ref 3.87–5.11)
RDW: 13.9 % (ref 11.5–15.5)
WBC: 4.3 10*3/uL (ref 4.0–10.5)
nRBC: 0 % (ref 0.0–0.2)

## 2023-07-08 NOTE — Anesthesia Preprocedure Evaluation (Signed)
Anesthesia Evaluation  Patient identified by MRN, date of birth, ID band Patient awake    Reviewed: Allergy & Precautions, NPO status , Patient's Chart, lab work & pertinent test results  History of Anesthesia Complications Negative for: history of anesthetic complications  Airway Mallampati: II  TM Distance: >3 FB Neck ROM: Full    Dental  (+) Dental Advisory Given, Caps, Loose,    Pulmonary COPD,  COPD inhaler, Current SmokerPatient did not abstain from smoking.   Pulmonary exam normal        Cardiovascular negative cardio ROS Normal cardiovascular exam     Neuro/Psych negative neurological ROS  negative psych ROS   GI/Hepatic negative GI ROS, Neg liver ROS,,,  Endo/Other  Hypothyroidism    Renal/GU negative Renal ROS     Musculoskeletal  (+) Arthritis , Osteoarthritis,    Abdominal   Peds  Hematology negative hematology ROS (+)   Anesthesia Other Findings   Reproductive/Obstetrics                             Anesthesia Physical Anesthesia Plan  ASA: 3  Anesthesia Plan: General   Post-op Pain Management: Minimal or no pain anticipated   Induction: Intravenous  PONV Risk Score and Plan: 2 and Treatment may vary due to age or medical condition, Ondansetron and TIVA  Airway Management Planned: Oral ETT  Additional Equipment: None  Intra-op Plan:   Post-operative Plan: Extubation in OR  Informed Consent: I have reviewed the patients History and Physical, chart, labs and discussed the procedure including the risks, benefits and alternatives for the proposed anesthesia with the patient or authorized representative who has indicated his/her understanding and acceptance.     Dental advisory given  Plan Discussed with: CRNA and Anesthesiologist  Anesthesia Plan Comments: (See PAT note  )       Anesthesia Quick Evaluation

## 2023-07-08 NOTE — Pre-Procedure Instructions (Signed)
PCP - Duanne Limerick, MD  COVID TEST- N  Anesthesia review: N  Patient verbally denies any shortness of breath, fever, cough and chest pain during phone call   -------------  SDW INSTRUCTIONS given:  Your procedure is scheduled on Tuesday, July23rd.  Report to [facility] Main Entrance "A" at 0645 A.M., and check in at the Admitting office.  Call this number if you have problems the morning of surgery:  [phone]   Remember:  Do not eat or drink after midnight the night before your surgery    Take these medicines the morning of surgery with A SIP OF WATER  fluticasone-salmeterol (WIXELA INHUB) thyroid (ARMOUR)   fluticasone (FLONASE)-if needed albuterol (VENTOLIN HFA)-if needed (Please bring on the day of surgery)  As of today, STOP taking any Aspirin (unless otherwise instructed by your surgeon) Aleve, Naproxen, Ibuprofen, Motrin, Advil, Goody's, BC's, all herbal medications, fish oil, and all vitamins.                      Do not wear jewelry, make up, or nail polish            Do not wear lotions, powders, perfumes/colognes, or deodorant.            Do not shave 48 hours prior to surgery.  Men may shave face and neck.            Do not bring valuables to the hospital.            [facility] is not responsible for any belongings or valuables.  Do NOT Smoke (Tobacco/Vaping) 24 hours prior to your procedure If you use a CPAP at night, you may bring all equipment for your overnight stay.   Contacts, glasses, dentures or bridgework may not be worn into surgery.      For patients admitted to the hospital, discharge time will be determined by your treatment team.   Patients discharged the day of surgery will not be allowed to drive home, and someone needs to stay with them for 24 hours.    Special instructions:   [facility]- Preparing For Surgery  Before surgery, you can play an important role. Because skin is not sterile, your skin needs to be as free of germs as  possible. You can reduce the number of germs on your skin by washing with CHG (chlorahexidine gluconate) Soap before surgery.  CHG is an antiseptic cleaner which kills germs and bonds with the skin to continue killing germs even after washing.    Oral Hygiene is also important to reduce your risk of infection.  Remember - BRUSH YOUR TEETH THE MORNING OF SURGERY WITH YOUR REGULAR TOOTHPASTE  Please do not use if you have an allergy to CHG or antibacterial soaps. If your skin becomes reddened/irritated stop using the CHG.  Do not shave (including legs and underarms) for at least 48 hours prior to first CHG shower. It is OK to shave your face.  Please follow these instructions carefully.   Shower the NIGHT BEFORE SURGERY and the MORNING OF SURGERY with DIAL Soap.   Pat yourself dry with a CLEAN TOWEL.  Wear CLEAN PAJAMAS to bed the night before surgery  Place CLEAN SHEETS on your bed the night of your first shower and DO NOT SLEEP WITH PETS.   Day of Surgery: Please shower morning of surgery  Wear Clean/Comfortable clothing the morning of surgery Do not apply any deodorants/lotions.   Remember to brush  your teeth WITH YOUR REGULAR TOOTHPASTE.   Questions were answered. Patient verbalized understanding of instructions.

## 2023-07-08 NOTE — Progress Notes (Signed)
Anesthesia Chart Review: [name]  Case: [id number] Date/Time: 07/09/23 0915   Procedures:      ROBOTIC ASSISTED NAVIGATIONAL BRONCHOSCOPY (Bilateral)     VIDEO BRONCHOSCOPY WITH ENDOBRONCHIAL ULTRASOUND   Anesthesia type: General   Diagnosis: Lung nodule [R91.1]   Pre-op diagnosis: lung nodule   [facility] / [facility]   Surgeons: Raechel Chute, MD       DISCUSSION: It is a 77 year old female scheduled for the above procedure. She is a smoker. 04/16/23 [facility] chest CT showed an area of developing soft tissue density within the anterior right apex of unclear significance but developing mass "a concern". A PET Scan was done on 05/07/23 suggesting RUL scarring but 14 mm posterior LLL nodules suspicious for noninvasive adenocarcinoma. Per 05/21/23 evaluation by Dr. Aundria Rud, biopsy of LLL lesion recommended and consideration of RUL nodule biopsy should pre-procedural CT showing that it has grown.  Other history includes smoking, emphysema, HLD, aortic atherosclerosis (on CT imaging), hypothyroidism, thyroid surgery (12/04/94 [facility] pathology report suggest right thyroid lobectomy and left thyroid nodule excision for multinodular goiter). S/p entropion repair bilateral eyelids 01/27/16 at [facility].  She had ED visit 06/04/23 for rash ("red bumps popping up on her legs and a couple spots on her abdomen and lower back."). She had pulled off a tick from her lower back about 10 days prior. No fever or body aches, but concerned she may have a tickborne illness.  She was prescribed a 7 day course of doxycycline 100 mg BID with consideration of rickettsial disease serology after treatment. She had spotted fever group antibodies, Lyme disease serology with Reflex, CBC through her PCP Duanne Limerick, MD lab visit 07/05/23. CBC was normal with antibody testing still in process. She did report symptoms have resolved, and initial tick bite would now be > 6 weeks ago.   She does not  carry a formal diagnosis of HTN, DM, or CAD, although with mild coronary calcifications and thoracic aorta atherosclerosis on 04/11/22 CT scan. She thought she may have had a "bundle branch block" on EKG in 1995 prior to her thyroid surgery at [facility]. There are no EKG narratives in Care Everywhere, and limited [facility] Everywhere records do not include any EKG findings. Her PCP is through [facility]) with no EKG in [facility] or [facility]. Endocrinologist Dr. Gregery Na office does not have an EKG on file there either. She did not have an EKG at [facility] prior to 2017 surgery. She denied chest pain, SOB, cough, fever during PAT RN phone call.   Patient is a same day work-up. Discussed available information with anesthesiologist Jairo Ben, MD. CBC done on 07/08/23. She denied CV symptoms per PAT call. EKG deferred unless felt warranted by her day of surgery anesthesia team.    VS:  BP Readings from Last 3 Encounters:  07/05/23 100/60  06/04/23 111/82  05/21/23 118/68   Pulse Readings from Last 3 Encounters:  07/05/23 66  06/04/23 83  05/21/23 67     PROVIDERS: Duanne Limerick, MD is PCP  Horton Chin, MD is endocrinologist [facility])   LABS: For day of surgery as indicated. CBC with diff was normal on 07/08/23. Glucose 98 on 05/07/23.    IMAGES: PET 05/07/23: IMPRESSION: - Right upper lobe scarring noted on recent CT chest is non FDG avid, benign. - 14 mm ground-glass opacity in the posterior left lower lobe, suspicious for noninvasive adenocarcinoma. - 8 mm short axis portacaval node,  max SUV 4.3, indeterminate. While this is unlikely to reflect nodal metastasis related to the suspected left lower lobe lung cancer, this still warrants attention on follow-up given the hypermetabolism on PET. - Additional ancillary findings as above.  CT Chest [facility] 04/16/23: IMPRESSION: 1. Lung-RADS 4B, suspicious. Additional imaging evaluation or consultation with  Pulmonology or Thoracic Surgery recommended. Area of developing soft tissue density within the anterior right apex is indeterminate. Although this could represent progressive asymmetric pleuroparenchymal scarring, developing mass is a concern. Consider further evaluation with PET. 2. Aortic atherosclerosis (ICD10-I70.0), coronary artery atherosclerosis and emphysema (ICD10-J43.9).   EKG: None available for review. See DISCUSSION.    CV: N/A  Past Medical History:  Diagnosis Date   Aortic atherosclerosis (HCC)    Emphysema lung (HCC)    Hypothyroidism    Osteoarthritis    Osteoporosis    Thyroid disease     Past Surgical History:  Procedure Laterality Date   THYROIDECTOMY, PARTIAL     TONSILLECTOMY     TUBAL LIGATION      MEDICATIONS: No current facility-administered medications for this encounter.    albuterol (VENTOLIN HFA) 108 (90 Base) MCG/ACT inhaler   Calcium Carb-Cholecalciferol (CALCIUM 600-D PO)   cetirizine-pseudoephedrine (ZYRTEC-D) 5-120 MG tablet   fluticasone (FLONASE) 50 MCG/ACT nasal spray   fluticasone-salmeterol (WIXELA INHUB) 250-50 MCG/ACT AEPB   Multiple Vitamin (MULTIVITAMIN) capsule   naproxen sodium (ALEVE) 220 MG tablet   thyroid (ARMOUR) 60 MG tablet   Vitamin D, Ergocalciferol, (DRISDOL) 50000 units CAPS capsule    Shonna Chock, PA-C Surgical Short Stay/Anesthesiology [facility] Phone [phone] [facility] Phone [phone] 07/08/2023 4:51 PM

## 2023-07-09 ENCOUNTER — Encounter (HOSPITAL_COMMUNITY): Payer: Self-pay | Admitting: Student in an Organized Health Care Education/Training Program

## 2023-07-09 ENCOUNTER — Ambulatory Visit (HOSPITAL_COMMUNITY): Payer: Medicare Other

## 2023-07-09 ENCOUNTER — Ambulatory Visit (HOSPITAL_COMMUNITY): Payer: Medicare Other | Admitting: Vascular Surgery

## 2023-07-09 ENCOUNTER — Other Ambulatory Visit: Payer: Self-pay

## 2023-07-09 ENCOUNTER — Ambulatory Visit (HOSPITAL_COMMUNITY)
Admission: RE | Admit: 2023-07-09 | Discharge: 2023-07-09 | Disposition: A | Discharge status: Home / Self Care | Payer: Medicare Other | Source: Ambulatory Visit | Attending: Student in an Organized Health Care Education/Training Program | Admitting: Student in an Organized Health Care Education/Training Program

## 2023-07-09 ENCOUNTER — Ambulatory Visit (HOSPITAL_BASED_OUTPATIENT_CLINIC_OR_DEPARTMENT_OTHER): Payer: Medicare Other | Admitting: Vascular Surgery

## 2023-07-09 ENCOUNTER — Encounter (HOSPITAL_COMMUNITY)
Admission: RE | Disposition: A | Discharge status: Home / Self Care | Payer: Self-pay | Source: Ambulatory Visit | Attending: Student in an Organized Health Care Education/Training Program

## 2023-07-09 DIAGNOSIS — F17200 Nicotine dependence, unspecified, uncomplicated: Secondary | ICD-10-CM | POA: Diagnosis not present

## 2023-07-09 DIAGNOSIS — I700 Atherosclerosis of aorta: Secondary | ICD-10-CM | POA: Diagnosis not present

## 2023-07-09 DIAGNOSIS — E039 Hypothyroidism, unspecified: Secondary | ICD-10-CM

## 2023-07-09 DIAGNOSIS — J439 Emphysema, unspecified: Secondary | ICD-10-CM | POA: Insufficient documentation

## 2023-07-09 DIAGNOSIS — M1990 Unspecified osteoarthritis, unspecified site: Secondary | ICD-10-CM | POA: Diagnosis not present

## 2023-07-09 DIAGNOSIS — J432 Centrilobular emphysema: Secondary | ICD-10-CM

## 2023-07-09 DIAGNOSIS — R911 Solitary pulmonary nodule: Secondary | ICD-10-CM

## 2023-07-09 DIAGNOSIS — M810 Age-related osteoporosis without current pathological fracture: Secondary | ICD-10-CM | POA: Diagnosis not present

## 2023-07-09 HISTORY — DX: Hypothyroidism, unspecified: E03.9

## 2023-07-09 HISTORY — PX: BRONCHIAL NEEDLE ASPIRATION BIOPSY: SHX5106

## 2023-07-09 LAB — BASIC METABOLIC PANEL
Anion gap: 9 (ref 5–15)
BUN: 13 mg/dL (ref 8–23)
CO2: 25 mmol/L (ref 22–32)
Calcium: 8.7 mg/dL — ABNORMAL LOW (ref 8.9–10.3)
Chloride: 106 mmol/L (ref 98–111)
Creatinine, Ser: 0.75 mg/dL (ref 0.44–1.00)
GFR, Estimated: >60 mL/min (ref >60)
Glucose, Bld: 94 mg/dL (ref 70–99)
Potassium: 3.6 mmol/L (ref 3.5–5.1)
Sodium: 140 mmol/L (ref 135–145)

## 2023-07-09 LAB — LYME DISEASE SEROLOGY W/REFLEX: Lyme Total Antibody EIA: NEGATIVE

## 2023-07-09 SURGERY — BRONCHOSCOPY, WITH BIOPSY USING ELECTROMAGNETIC NAVIGATION
Anesthesia: General | Laterality: Bilateral

## 2023-07-09 MED ORDER — ONDANSETRON HCL 4 MG/2ML IJ SOLN
INTRAMUSCULAR | Status: DC | PRN
  Administered 2023-07-09: 4 mg via INTRAVENOUS

## 2023-07-09 MED ORDER — OXYCODONE HCL 5 MG PO TABS: 5.0000 mg | ORAL_TABLET | Freq: Once | ORAL | Status: DC | PRN

## 2023-07-09 MED ORDER — FENTANYL CITRATE (PF) 100 MCG/2ML IJ SOLN
INTRAMUSCULAR | Status: DC | PRN
  Administered 2023-07-09: 100 ug via INTRAVENOUS

## 2023-07-09 MED ORDER — LIDOCAINE 2% (20 MG/ML) 5 ML SYRINGE
INTRAMUSCULAR | Status: DC | PRN
  Administered 2023-07-09: 60 mg via INTRAVENOUS

## 2023-07-09 MED ORDER — FENTANYL CITRATE (PF) 100 MCG/2ML IJ SOLN: 25.0000 ug | INTRAMUSCULAR | Status: DC | PRN

## 2023-07-09 MED ORDER — SUGAMMADEX SODIUM 200 MG/2ML IV SOLN
INTRAVENOUS | Status: DC | PRN
  Administered 2023-07-09: 121.6 mg via INTRAVENOUS

## 2023-07-09 MED ORDER — DEXAMETHASONE SODIUM PHOSPHATE 10 MG/ML IJ SOLN
INTRAMUSCULAR | Status: DC | PRN
  Administered 2023-07-09: 4 mg via INTRAVENOUS

## 2023-07-09 MED ORDER — LACTATED RINGERS IV SOLN: INTRAVENOUS | Status: DC

## 2023-07-09 MED ORDER — PROPOFOL 10 MG/ML IV BOLUS
INTRAVENOUS | Status: DC | PRN
Start: 2023-07-09 — End: 2023-07-09
  Administered 2023-07-09: 10 mg via INTRAVENOUS
  Administered 2023-07-09: 110 mg via INTRAVENOUS

## 2023-07-09 MED ORDER — OXYCODONE HCL 5 MG/5ML PO SOLN: 5.0000 mg | Freq: Once | ORAL | Status: DC | PRN

## 2023-07-09 MED ORDER — PHENYLEPHRINE HCL-NACL 20-0.9 MG/250ML-% IV SOLN
INTRAVENOUS | Status: DC | PRN
  Administered 2023-07-09: 15 ug/min via INTRAVENOUS

## 2023-07-09 MED ORDER — ONDANSETRON HCL 4 MG/2ML IJ SOLN: 4.0000 mg | Freq: Once | INTRAMUSCULAR | Status: DC | PRN

## 2023-07-09 MED ORDER — CHLORHEXIDINE GLUCONATE 0.12 % MT SOLN
15.0000 mL | Freq: Once | OROMUCOSAL | Status: AC
  Administered 2023-07-09: 15 mL via OROMUCOSAL
  Filled 2023-07-09: qty 15

## 2023-07-09 MED ORDER — ROCURONIUM BROMIDE 10 MG/ML (PF) SYRINGE
PREFILLED_SYRINGE | INTRAVENOUS | Status: DC | PRN
  Administered 2023-07-09: 50 mg via INTRAVENOUS

## 2023-07-09 MED ORDER — PROPOFOL 500 MG/50ML IV EMUL
INTRAVENOUS | Status: DC | PRN
  Administered 2023-07-09: 150 ug/kg/min via INTRAVENOUS

## 2023-07-09 NOTE — H&P (Signed)
Assessment & Plan:   #Lung Nodule  Patient was noted to have a LLL ground glass nodule that is concerning for slow growing adenocarcinoma. She was also noted to have an area of soft tissue attentuation in the apex of the RUL that was not PET avid and is likely to represent scar tissue. Patient is presenting today for robotic assisted navigational bronchoscopy to the LLL ground glass nodule. She is appropriate for the procedure.   Raechel Chute, MD [facility] 07/09/2023 9:00 AM    End of visit medications:  Meds ordered this encounter  Medications   lactated ringers infusion   chlorhexidine (PERIDEX) 0.12 % solution 15 mL     Current Facility-Administered Medications:    lactated ringers infusion, , Intravenous, Continuous, Beryle Lathe, MD, Last Rate: 10 mL/hr at 07/09/23 0810, Continued from Pre-op at 07/09/23 0810   Subjective:   PATIENT ID: [name] GENDER: female DOB: 06/04/1946, MRN: [id number]  No chief complaint on file.   HPI  Patient is a pleasant 77 year old female presenting for the evaluation of pulmonary nodule noted on low dose CT for lung cancer screening.   Patient was enrolled in our lung cancer screening program last year, with initial CT performed 03/2022. Follow up CT 04/2023 showed a ground glass LLL nodule measuring 17 mm, similar to the year prior. An area of soft tissue density within the anterior right apex and upper lobe was noted to be new, measuring 17 mm in diameter, concerning for malignancy. She was referred to pulmonology for evaluation.  This was followed by a PET/CT performed 05/07/2023 which did not show areas of PET avidity in RUL. The PET reports notes the RUL lesion to likely be a scar, with more weight and concern given to the LLL ground glass opacity. There was also an 8 mm short axis portacaval node that showed increased FDG avidity.   Patient is in her usual state of health, and has no complaints. They  were unable to go to [place] due to her husband's illness. There is no chest pain, chest tightness, wheezing, shortness of breath, or cough. Patient has a long standing history of smoking, with around 50 pack years of smoking history.    Ancillary information including prior medications, full medical/surgical/family/social histories, and PFTs (when available) are listed below and have been reviewed.   Review of Systems  Constitutional:  Negative for chills, fever and weight loss.  Respiratory:  Negative for cough, hemoptysis, sputum production, shortness of breath and wheezing.   Cardiovascular:  Negative for chest pain.     Objective:   Vitals:   07/09/23 0700  BP: 114/65  Pulse: 72  Resp: 18  Temp: 97.9 F (36.6 C)  TempSrc: Oral  SpO2: 96%  Weight: 60.8 kg  Height: 5\' 4"  (1.626 m)   96% on RA  BMI Readings from Last 3 Encounters:  07/09/23 23.00 kg/m  07/05/23 23.17 kg/m  05/21/23 25.43 kg/m   Wt Readings from Last 3 Encounters:  07/09/23 60.8 kg  07/05/23 61.2 kg  05/21/23 62.1 kg    Physical Exam Constitutional:      Appearance: Normal appearance.  Cardiovascular:     Rate and Rhythm: Normal rate and regular rhythm.     Pulses: Normal pulses.     Heart sounds: Normal heart sounds.  Pulmonary:     Effort: Pulmonary effort is normal.     Breath sounds: Normal breath sounds.  Abdominal:  Palpations: Abdomen is soft.  Neurological:     General: No focal deficit present.     Mental Status: She is alert and oriented to person, place, and time. Mental status is at baseline.       Ancillary Information    Past Medical History:  Diagnosis Date   Aortic atherosclerosis (HCC)    Emphysema lung (HCC)    Hypothyroidism    Osteoarthritis    Osteoporosis    Thyroid disease      History reviewed. No pertinent family history.   Past Surgical History:  Procedure Laterality Date   THYROIDECTOMY, PARTIAL     TONSILLECTOMY     TUBAL LIGATION       Social History   Socioeconomic History   Marital status: Married    Spouse name: Not on file   Number of children: 3   Years of education: Not on file   Highest education level: Not on file  Occupational History   Not on file  Tobacco Use   Smoking status: Every Day    Current packs/day: 0.75    Average packs/day: 0.8 packs/day for 56.0 years (42.0 ttl pk-yrs)    Types: Cigarettes   Smokeless tobacco: Never  Vaping Use   Vaping status: Never Used  Substance and Sexual Activity   Alcohol use: Yes    Alcohol/week: 1.0 standard drink of alcohol    Types: 1 Glasses of wine per week    Comment: glass of wine once a month   Drug use: No   Sexual activity: Not Currently  Other Topics Concern   Not on file  Social History Narrative   Not on file   Social Determinants of Health   Financial Resource Strain: Low Risk  (11/27/2022)   Overall Financial Resource Strain (CARDIA)    Difficulty of Paying Living Expenses: Not hard at all  Food Insecurity: No Food Insecurity (11/27/2022)   Hunger Vital Sign    Worried About Running Out of Food in the Last Year: Never true    Ran Out of Food in the Last Year: Never true  Transportation Needs: No Transportation Needs (11/27/2022)   PRAPARE - Administrator, Civil Service (Medical): No    Lack of Transportation (Non-Medical): No  Physical Activity: Inactive (09/26/2022)   Exercise Vital Sign    Days of Exercise per Week: 0 days    Minutes of Exercise per Session: 0 min  Stress: No Stress Concern Present (08/02/2021)   [employer] of Occupational Health - Occupational Stress Questionnaire    Feeling of Stress : Only a little  Social Connections: Moderately Isolated (09/26/2022)   Social Connection and Isolation Panel [NHANES]    Frequency of Communication with Friends and Family: More than three times a week    Frequency of Social Gatherings with Friends and Family: Three times a week    Attends Religious  Services: Never    Active Member of Clubs or Organizations: No    Attends Banker Meetings: Never    Marital Status: Married  Catering manager Violence: Not At Risk (11/27/2022)   Humiliation, Afraid, Rape, and Kick questionnaire    Fear of Current or Ex-Partner: No    Emotionally Abused: No    Physically Abused: No    Sexually Abused: No     Allergies  Allergen Reactions   Shrimp Extract Anaphylaxis    Has epipen   Sulfa Antibiotics     GI Bleeding     CBC  Component Value Date/Time   WBC 4.3 07/08/2023 0825   RBC 4.04 07/08/2023 0825   HGB 12.6 07/08/2023 0825   HCT 37.8 07/08/2023 0825   PLT 206 07/08/2023 0825   MCV 93.6 07/08/2023 0825   MCH 31.2 07/08/2023 0825   MCHC 33.3 07/08/2023 0825   RDW 13.9 07/08/2023 0825   LYMPHSABS 1.8 07/08/2023 0825   MONOABS 0.4 07/08/2023 0825   EOSABS 0.1 07/08/2023 0825   BASOSABS 0.0 07/08/2023 0825    Pulmonary Functions Testing Results:     No data to display          @ENCMEDSTART @

## 2023-07-09 NOTE — Anesthesia Postprocedure Evaluation (Signed)
Anesthesia Post Note  Patient: Jacynda Brunke  Procedure(s) Performed: ROBOTIC ASSISTED NAVIGATIONAL BRONCHOSCOPY (Bilateral) BRONCHIAL NEEDLE ASPIRATION BIOPSIES     Patient location during evaluation: PACU Anesthesia Type: General Level of consciousness: awake and alert Pain management: pain level controlled Vital Signs Assessment: post-procedure vital signs reviewed and stable Respiratory status: spontaneous breathing, nonlabored ventilation and respiratory function stable Cardiovascular status: stable and blood pressure returned to baseline Anesthetic complications: no   No notable events documented.  Last Vitals:  Vitals:   07/09/23 1015 07/09/23 1024  BP: 125/71 119/61  Pulse: 78 76  Resp: 16 14  Temp:  (!) 36.4 C  SpO2: 94% 95%    Last Pain:  Vitals:   07/09/23 1024  TempSrc:   PainSc: 0-No pain                 Beryle Lathe

## 2023-07-09 NOTE — Transfer of Care (Signed)
Immediate Anesthesia Transfer of Care Note  Patient: [name]  Procedure(s) Performed: ROBOTIC ASSISTED NAVIGATIONAL BRONCHOSCOPY (Bilateral) BRONCHIAL NEEDLE ASPIRATION BIOPSIES  Patient Location: PACU  Anesthesia Type:General  Level of Consciousness: awake and alert   Airway & Oxygen Therapy: Patient Spontanous Breathing and Patient connected to nasal cannula oxygen  Post-op Assessment: Report given to RN and Post -op Vital signs reviewed and stable  Post vital signs: Reviewed and stable  Last Vitals:  Vitals Value Taken Time  BP 122/55 07/09/23 0954  Temp 36.3 C 07/09/23 0954  Pulse 78 07/09/23 0955  Resp 22 07/09/23 0955  SpO2 95 % 07/09/23 0955  Vitals shown include unfiled device data.  Last Pain:  Vitals:   07/09/23 0816  TempSrc:   PainSc: 0-No pain         Complications: No notable events documented.

## 2023-07-09 NOTE — Op Note (Signed)
Video Bronchoscopy with Robotic Assisted Bronchoscopic Navigation   Date of Operation: 07/09/2023   Pre-op Diagnosis: LLL nodule  Surgeon: Raechel Chute, MD  Anesthesia: General endotracheal anesthesia  Operation: Flexible video fiberoptic bronchoscopy with robotic assistance and biopsies.  Estimated Blood Loss: Minimal  Complications: None  Indications and History: [name] is a 77 y.o. female with history of emphysema presenting with a LLL nodule for biopsy. The risks, benefits, complications, treatment options and expected outcomes were discussed with the patient.  The possibilities of pneumothorax, pneumonia, reaction to medication, pulmonary aspiration, perforation of a viscus, bleeding, failure to diagnose a condition and creating a complication requiring transfusion or operation were discussed with the patient who freely signed the consent.    Description of Procedure: The patient was seen in the Preoperative Area, was examined and was deemed appropriate to proceed.  The patient was taken to [facility] endoscopy room 3, identified as [name] and the procedure verified as Flexible Video Fiberoptic Bronchoscopy.  A Time Out was held and the above information confirmed.   Prior to the date of the procedure a high-resolution CT scan of the chest was performed. Utilizing ION software program a virtual tracheobronchial tree was generated to allow the creation of distinct navigation pathways to the patient's parenchymal abnormalities. After being taken to the operating room general anesthesia was initiated and the patient  was orally intubated. The video fiberoptic bronchoscope was introduced via the endotracheal tube and a general inspection was performed which showed normal right and left lung anatomy, aspiration of the bilateral mainstems was completed to remove any remaining secretions. Robotic catheter inserted into patient's endotracheal tube.   Target #1 LLL nodule: The  distinct navigation pathways prepared prior to this procedure were then utilized to navigate to patient's lesion identified on CT scan. The robotic catheter was secured into place and the vision probe was withdrawn.  Lesion location was approximated using fluoroscopy and a spin of the CIOS cone beam CT system. Under fluoroscopic guidance transbronchial needle biopsies were obtained. Given proximity of the nodule to the pleural, I opted to not perform transbronchial biopsies. Samples were sent for cytology.  At the end of the procedure a general airway inspection was performed and there was no evidence of active bleeding. The bronchoscope was removed.  The patient tolerated the procedure well. There was no significant blood loss and there were no obvious complications. A post-procedural chest x-ray is pending.  Samples Target #1: 1. Transbronchial needle biopsies from LLL ground glass nodule (10 passes)  Plans:  The patient will be discharged from the PACU to home when recovered from anesthesia and after chest x-ray is reviewed. We will review the cytology, pathology and microbiology results with the patient when they become available. Outpatient followup will be with me.  Raechel Chute, MD [facility] 07/09/2023 9:47 AM

## 2023-07-09 NOTE — Anesthesia Procedure Notes (Signed)
Procedure Name: Intubation Date/Time: 07/09/2023 9:09 AM  Performed by: Rachel Moulds, CRNAPre-anesthesia Checklist: Patient identified, Emergency Drugs available, Suction available, Patient being monitored and Timeout performed Patient Re-evaluated:Patient Re-evaluated prior to induction Oxygen Delivery Method: Circle system utilized Preoxygenation: Pre-oxygenation with 100% oxygen Induction Type: IV induction Ventilation: Mask ventilation without difficulty Laryngoscope Size: Mac and 3 Grade View: Grade II Tube type: Oral Tube size: 8.5 mm Number of attempts: 1 Airway Equipment and Method: Stylet Placement Confirmation: ETT inserted through vocal cords under direct vision, positive ETCO2, breath sounds checked- equal and bilateral and CO2 detector Secured at: 21 cm Tube secured with: Tape Dental Injury: Teeth and Oropharynx as per pre-operative assessment

## 2023-07-10 LAB — CYTOLOGY - NON PAP

## 2023-07-11 ENCOUNTER — Encounter (HOSPITAL_COMMUNITY): Payer: Self-pay | Admitting: Student in an Organized Health Care Education/Training Program

## 2023-07-11 ENCOUNTER — Other Ambulatory Visit: Payer: Self-pay

## 2023-07-11 DIAGNOSIS — F17210 Nicotine dependence, cigarettes, uncomplicated: Secondary | ICD-10-CM

## 2023-07-11 DIAGNOSIS — Z87891 Personal history of nicotine dependence: Secondary | ICD-10-CM

## 2023-07-11 DIAGNOSIS — Z122 Encounter for screening for malignant neoplasm of respiratory organs: Secondary | ICD-10-CM

## 2023-07-16 ENCOUNTER — Other Ambulatory Visit: Payer: Self-pay

## 2023-07-16 DIAGNOSIS — E039 Hypothyroidism, unspecified: Secondary | ICD-10-CM

## 2023-07-25 ENCOUNTER — Other Ambulatory Visit: Admission: RE | Admit: 2023-07-25 | Payer: Medicare Other | Source: Home / Self Care

## 2023-08-28 IMAGING — CT CT CHEST LUNG CANCER SCREENING LOW DOSE W/O CM
1 series · 15 of 31 positions shown, 19 images · non-contrast
Comparison: None.

CLINICAL DATA: Current smoker with 56 pack-year history



[Series 2: axial st · axial · 0.61mm/px · z∈[-716,-436]mm · 15 of 62 slices shown, 19 images]
[im 3/62  mediastinal]
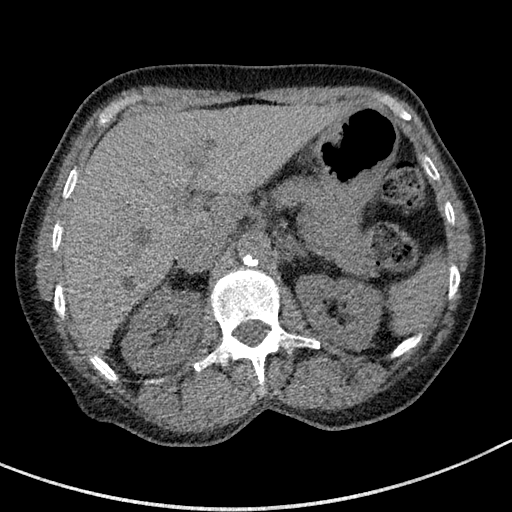
[im 3/62  lung]
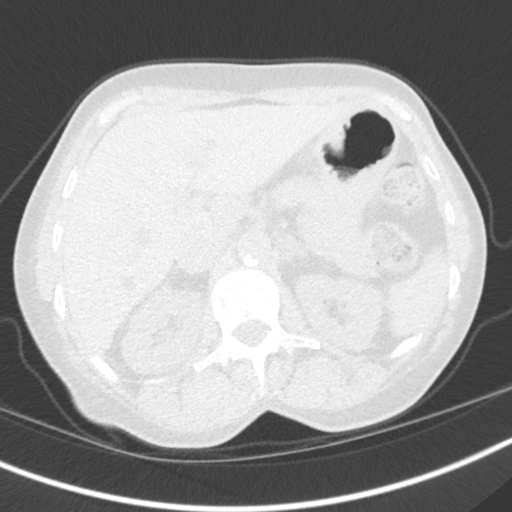
[im 7/62  lung]
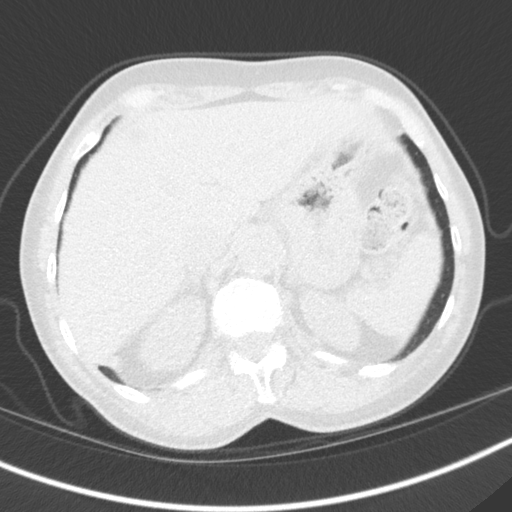
[im 12/62  lung]
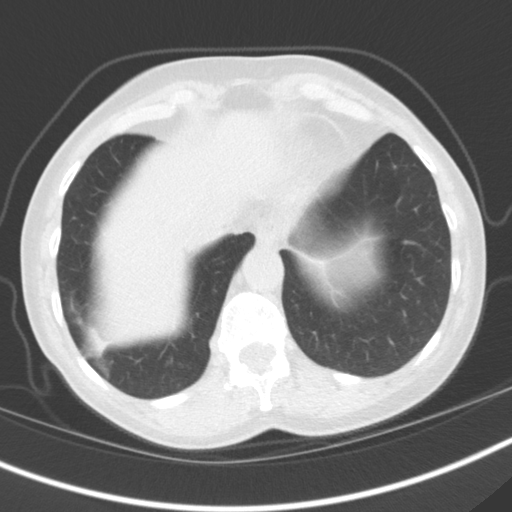
[im 14/62  lung]
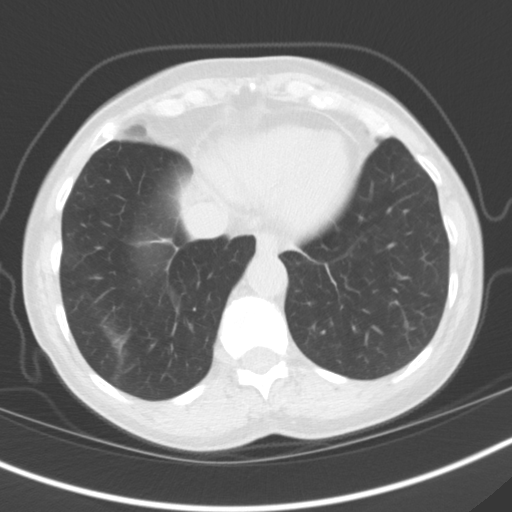
[im 19/62  mediastinal]
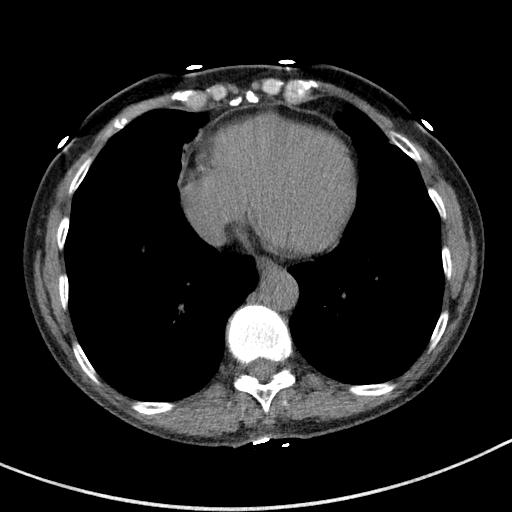
[im 19/62  lung]
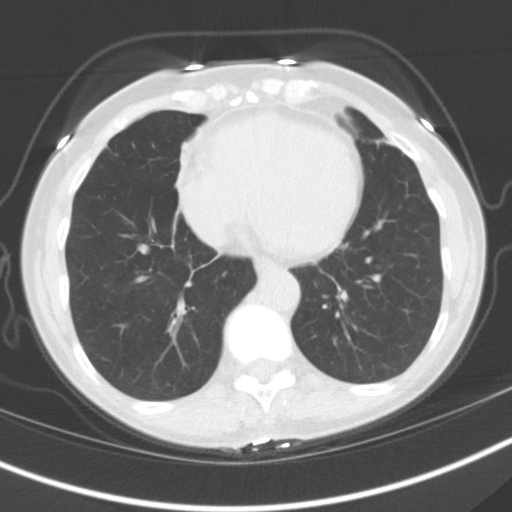
[im 23/62  lung]
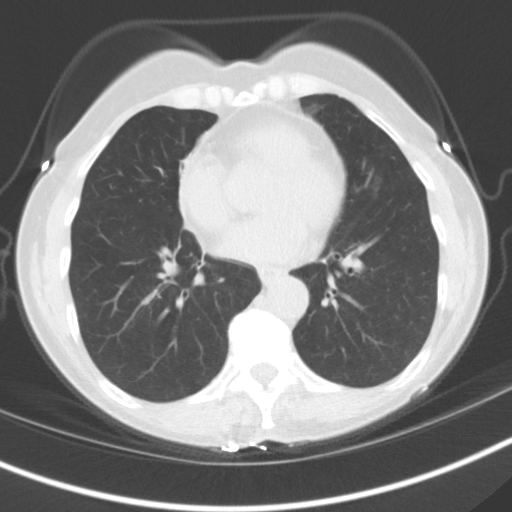
[im 28/62  lung]
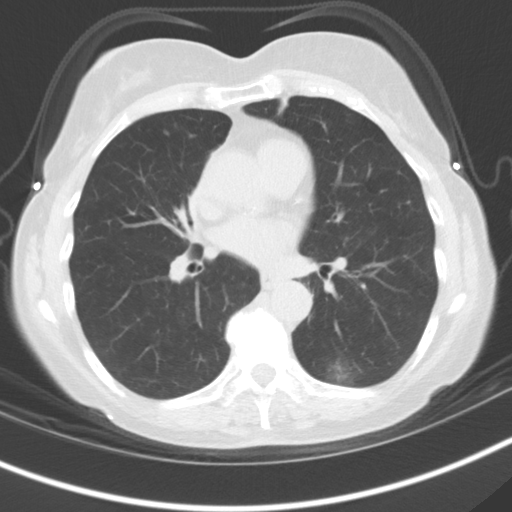
[im 32/62  lung]
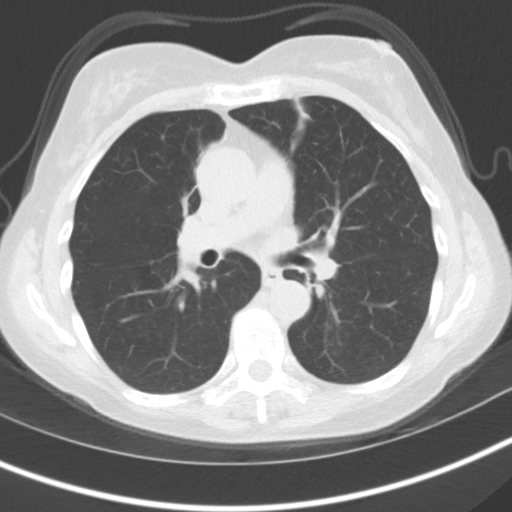
[im 34/62  mediastinal]
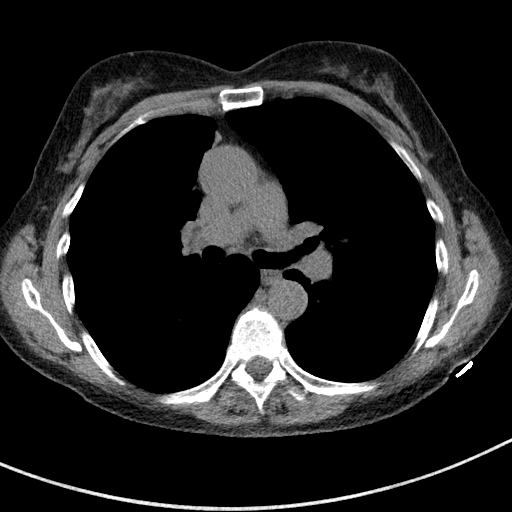
[im 34/62  lung]
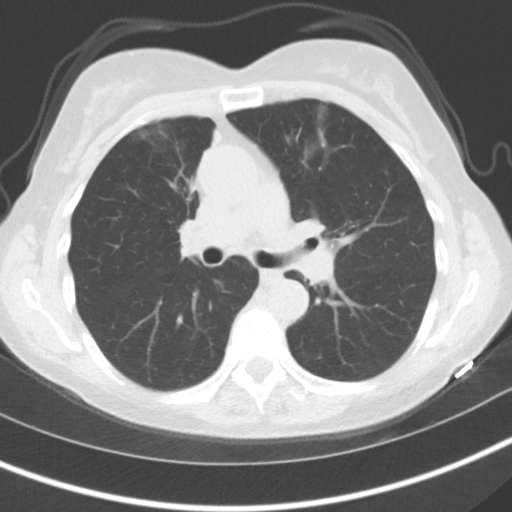
[im 39/62  lung]
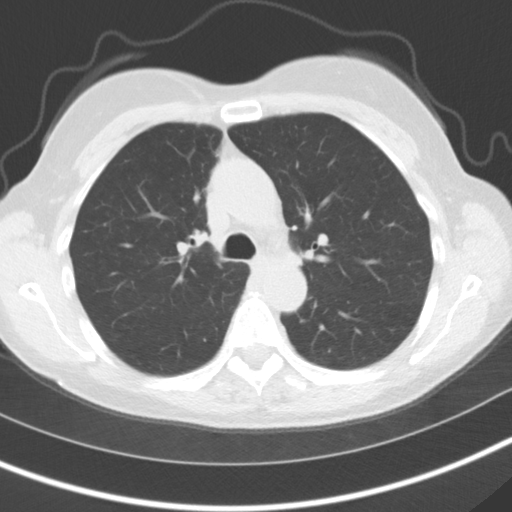
[im 43/62  lung]
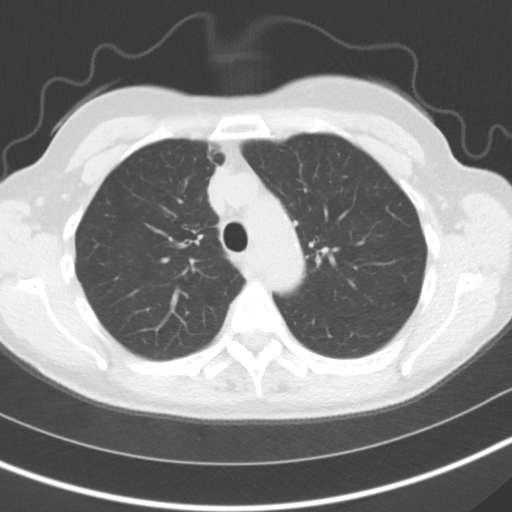
[im 48/62  lung]
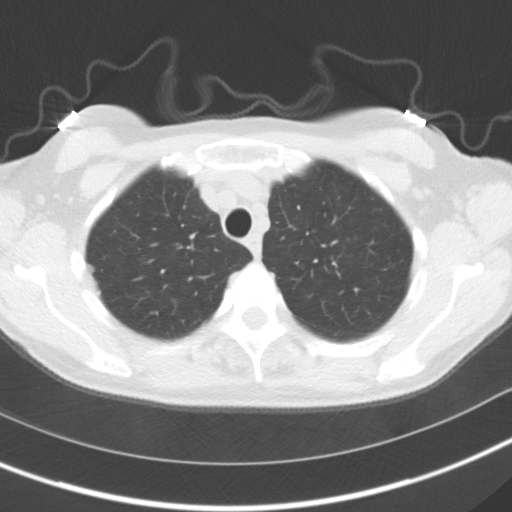
[im 50/62  mediastinal]
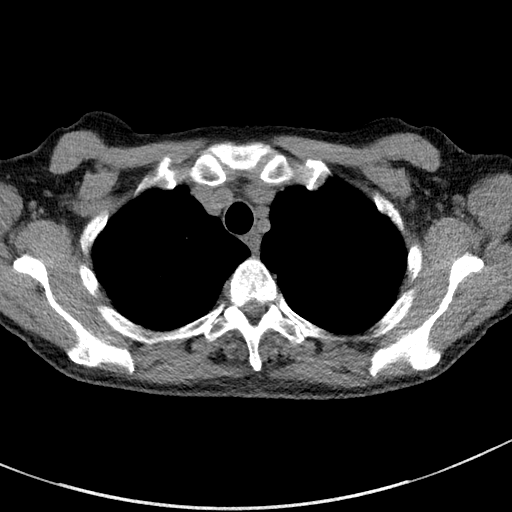
[im 50/62  lung]
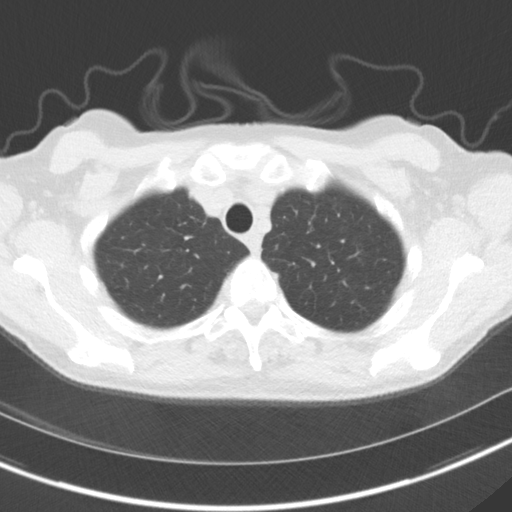
[im 55/62  lung]
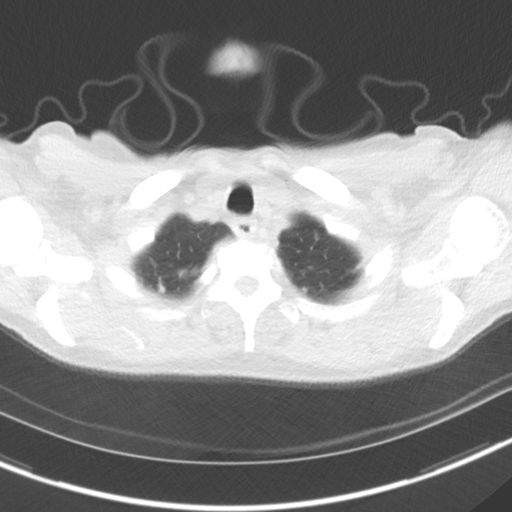
[im 59/62  lung]
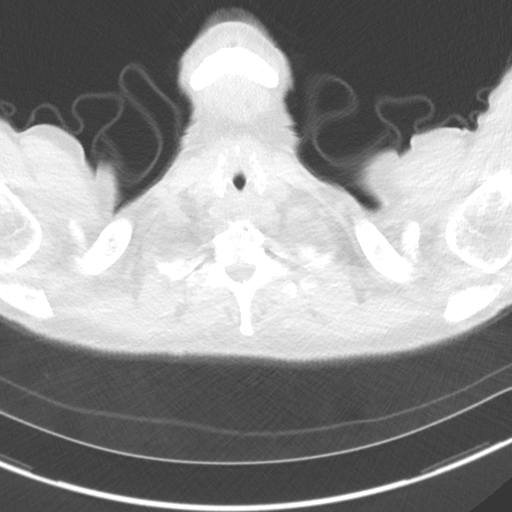

[15 of 31 positions shown; findings below may reference images not displayed]

FINDINGS: Cardiovascular: Normal heart size. Trace pericardial effusion. Mild
coronary artery calcifications. Atherosclerotic disease of the
thoracic aorta.

Mediastinum/Nodes: Esophagus is unremarkable. No pathologically
enlarged lymph nodes seen in the chest.

Lungs/Pleura: Central airways are patent. Mild centrilobular
emphysema. No consolidation, pleural effusion or pneumothorax.
Scattered linear opacities, likely due to scarring atelectasis.
Bilateral solid and ground-glass pulmonary nodules. Largest
juxtapleural solid nodule of the right upper lobe measures 5.8 mm on
image 71. Ground-glass nodule of the left lower lobe measuring
mm on image 167.

Upper Abdomen: Numerous low-attenuation lesions are seen in the
liver which are likely cysts. No acute abnormality.

Musculoskeletal: No chest wall mass or suspicious bone lesions
identified.
IMPRESSION: 1. Lung-RADS 2, benign appearance or behavior. Continue annual
screening with low-dose chest CT without contrast in 12 months.
2. Aortic Atherosclerosis (ER3DO-A04.4) and Emphysema (ER3DO-4G6.6).

## 2023-09-10 ENCOUNTER — Encounter: Payer: Self-pay | Admitting: Family Medicine

## 2023-09-10 ENCOUNTER — Ambulatory Visit (INDEPENDENT_AMBULATORY_CARE_PROVIDER_SITE_OTHER): Payer: Medicare Other | Admitting: Family Medicine

## 2023-09-10 VITALS — BP 120/68 | HR 68 | Temp 98.9°F | Ht 64.0 in | Wt 138.0 lb

## 2023-09-10 DIAGNOSIS — R051 Acute cough: Secondary | ICD-10-CM | POA: Diagnosis not present

## 2023-09-10 DIAGNOSIS — J432 Centrilobular emphysema: Secondary | ICD-10-CM

## 2023-09-10 DIAGNOSIS — J0100 Acute maxillary sinusitis, unspecified: Secondary | ICD-10-CM

## 2023-09-10 LAB — POC COVID19 BINAXNOW: SARS Coronavirus 2 Ag: NEGATIVE

## 2023-09-10 MED ORDER — ALBUTEROL SULFATE HFA 108 (90 BASE) MCG/ACT IN AERS
2.0000 | INHALATION_SPRAY | Freq: Four times a day (QID) | RESPIRATORY_TRACT | 6 refills | Status: DC | PRN
Start: 2023-09-10 — End: 2024-01-30

## 2023-09-10 MED ORDER — AMOXICILLIN-POT CLAVULANATE 875-125 MG PO TABS
1.0000 | ORAL_TABLET | Freq: Two times a day (BID) | ORAL | 0 refills | Status: DC
Start: 2023-09-10 — End: 2023-09-23

## 2023-09-10 MED ORDER — FLUTICASONE-SALMETEROL 250-50 MCG/ACT IN AEPB
1.0000 | INHALATION_SPRAY | Freq: Every day | RESPIRATORY_TRACT | 6 refills | Status: DC
Start: 2023-09-10 — End: 2024-01-21

## 2023-09-10 NOTE — Progress Notes (Signed)
Date:  09/10/2023   Name:  [name]   DOB:  Dec 28, 1945   MRN:  [id number]   Chief Complaint: Cough (Congestion- no color but thicker than usual)  Cough Associated symptoms include a fever, rhinorrhea, shortness of breath and wheezing. Pertinent negatives include no chest pain, chills, ear congestion, ear pain, myalgias, postnasal drip, rash, sore throat, sweats or weight loss.  Sinusitis This is a new problem. The current episode started in the past 7 days. The problem has been waxing and waning since onset. There has been no fever. The pain is mild. Associated symptoms include congestion, coughing and shortness of breath. Pertinent negatives include no chills, ear pain, sinus pressure or sore throat.    Lab Results  Component Value Date   NA 140 07/09/2023   K 3.6 07/09/2023   CO2 25 07/09/2023   GLUCOSE 94 07/09/2023   BUN 13 07/09/2023   CREATININE 0.75 07/09/2023   CALCIUM 8.7 (L) 07/09/2023   GFRNONAA >60 07/09/2023   No results found for: "CHOL", "HDL", "LDLCALC", "LDLDIRECT", "TRIG", "CHOLHDL" No results found for: "TSH" No results found for: "HGBA1C" Lab Results  Component Value Date   WBC 4.3 07/08/2023   HGB 12.6 07/08/2023   HCT 37.8 07/08/2023   MCV 93.6 07/08/2023   PLT 206 07/08/2023   No results found for: "ALT", "AST", "GGT", "ALKPHOS", "BILITOT" No results found for: "25OHVITD2", "25OHVITD3", "VD25OH"   Review of Systems  Constitutional:  Positive for fever. Negative for chills and weight loss.  HENT:  Positive for congestion and rhinorrhea. Negative for ear pain, postnasal drip, sinus pressure and sore throat.   Respiratory:  Positive for cough, shortness of breath and wheezing.   Cardiovascular:  Negative for chest pain.  Musculoskeletal:  Negative for myalgias.  Skin:  Negative for rash.    Patient Active Problem List   Diagnosis Date Noted   Lung nodule 06/06/2023   Centrilobular emphysema (HCC) 04/10/2019   Tobacco use 01/09/2016    Hypothyroidism 01/09/2016   Arthritis 01/09/2016    Allergies  Allergen Reactions   Shrimp Extract Anaphylaxis    Has epipen   Sulfa Antibiotics     GI Bleeding    Past Surgical History:  Procedure Laterality Date   BRONCHIAL NEEDLE ASPIRATION BIOPSY  07/09/2023   Procedure: BRONCHIAL NEEDLE ASPIRATION BIOPSIES;  Surgeon: Raechel Chute, MD;  Location: [facility] ENDOSCOPY;  Service: Pulmonary;;   THYROIDECTOMY, PARTIAL     TONSILLECTOMY     TUBAL LIGATION      Social History   Tobacco Use   Smoking status: Every Day    Current packs/day: 0.75    Average packs/day: 0.8 packs/day for 56.0 years (42.0 ttl pk-yrs)    Types: Cigarettes   Smokeless tobacco: Never  Vaping Use   Vaping status: Never Used  Substance Use Topics   Alcohol use: Yes    Alcohol/week: 1.0 standard drink of alcohol    Types: 1 Glasses of wine per week    Comment: glass of wine once a month   Drug use: No     Medication list has been reviewed and updated.  Current Meds  Medication Sig   albuterol (VENTOLIN HFA) 108 (90 Base) MCG/ACT inhaler Inhale 2 puffs into the lungs every 6 (six) hours as needed for wheezing or shortness of breath.   Calcium Carb-Cholecalciferol (CALCIUM 600-D PO) Take 1 tablet by mouth daily.   cetirizine-pseudoephedrine (ZYRTEC-D) 5-120 MG tablet Take 1 tablet by mouth daily as needed for  allergies.   fluticasone (FLONASE) 50 MCG/ACT nasal spray Place 2 sprays into both nostrils 2 (two) times daily. (Patient taking differently: Place 2 sprays into both nostrils daily as needed for allergies.)   fluticasone-salmeterol (WIXELA INHUB) 250-50 MCG/ACT AEPB Inhale 1 puff into the lungs 2 (two) times daily. (Patient taking differently: Inhale 1 puff into the lungs daily.)   Multiple Vitamin (MULTIVITAMIN) capsule Take 1 capsule by mouth daily.   Multiple Vitamins-Minerals (ONE A DAY IMMUNITY DEFENSE PO) Take by mouth.   naproxen sodium (ALEVE) 220 MG tablet Take 220-440 mg by mouth 2  (two) times daily as needed (pain).   thyroid (ARMOUR) 60 MG tablet Take 60 mg by mouth daily before breakfast.   Vitamin D, Ergocalciferol, (DRISDOL) 50000 units CAPS capsule Take 50,000 Units by mouth every 7 (seven) days. Dr Nelia Shi       09/10/2023    4:20 PM 07/05/2023   10:11 AM 11/27/2022    3:32 PM 06/01/2022    9:32 AM  GAD 7 : Generalized Anxiety Score  Nervous, Anxious, on Edge 0 0 1 1  Control/stop worrying 0 0 1 1  Worry too much - different things 0 0 1 1  Trouble relaxing 0 0 2 1  Restless 0 0 0 0  Easily annoyed or irritable 0 0 2 1  Afraid - awful might happen 0 0 0 0  Total GAD 7 Score 0 0 7 5  Anxiety Difficulty Not difficult at all Not difficult at all Not difficult at all Not difficult at all       09/10/2023    4:20 PM 07/05/2023   10:10 AM 11/27/2022    3:30 PM  Depression screen PHQ 2/9  Decreased Interest 0 0 0  Down, Depressed, Hopeless 0 1 0  PHQ - 2 Score 0 1 0  Altered sleeping 0 0 2  Tired, decreased energy 0 0 2  Change in appetite 0 0 0  Feeling bad or failure about yourself  0 0 0  Trouble concentrating 0 0 0  Moving slowly or fidgety/restless 0 0 0  Suicidal thoughts 0 0 0  PHQ-9 Score 0 1 4  Difficult doing work/chores Not difficult at all Not difficult at all     BP Readings from Last 3 Encounters:  09/10/23 120/68  07/09/23 119/61  07/05/23 100/60    Physical Exam Vitals and nursing note reviewed. Exam conducted with a chaperone present.  Constitutional:      General: She is not in acute distress.    Appearance: She is not diaphoretic.  HENT:     Head: Normocephalic and atraumatic.     Right Ear: Tympanic membrane, ear canal and external ear normal.     Left Ear: Tympanic membrane, ear canal and external ear normal.     Nose: Nose normal. No congestion or rhinorrhea.     Mouth/Throat:     Mouth: Mucous membranes are moist.     Pharynx: No oropharyngeal exudate or posterior oropharyngeal erythema.  Eyes:     General:         Right eye: No discharge.        Left eye: No discharge.     Conjunctiva/sclera: Conjunctivae normal.     Pupils: Pupils are equal, round, and reactive to light.  Neck:     Thyroid: No thyromegaly.     Vascular: No JVD.  Cardiovascular:     Rate and Rhythm: Normal rate and regular rhythm.  Heart sounds: Normal heart sounds. No murmur heard.    No friction rub. No gallop.  Pulmonary:     Effort: Pulmonary effort is normal.     Breath sounds: Normal breath sounds. No wheezing, rhonchi or rales.  Chest:     Chest wall: No tenderness.  Abdominal:     General: Bowel sounds are normal.     Palpations: Abdomen is soft. There is no mass.     Tenderness: There is no abdominal tenderness. There is no guarding or rebound.  Musculoskeletal:        General: Normal range of motion.     Cervical back: Normal range of motion and neck supple.  Lymphadenopathy:     Cervical: No cervical adenopathy.  Skin:    General: Skin is warm and dry.     Findings: No bruising, erythema, lesion or rash.  Neurological:     Mental Status: She is alert.     Wt Readings from Last 3 Encounters:  09/10/23 138 lb (62.6 kg)  07/09/23 134 lb (60.8 kg)  07/05/23 135 lb (61.2 kg)    BP 120/68   Pulse 68   Temp 98.9 F (37.2 C) (Oral)   Ht 5\' 4"  (1.626 m)   Wt 138 lb (62.6 kg)   SpO2 97%   BMI 23.69 kg/m   Assessment and Plan: 1. Acute cough New onset.  Persistent.  Stable.  Patient returns from a [place] trip to the beach with [place].  Low-grade fever with some chills and cough productive of phlegm.  Patient has baseline shortness of breath but is controlled with her long-term Wixela and episodic albuterol rescue inhalers.  COVID test was done and it was noted to be negative. - POC COVID-19  2. Acute maxillary sinusitis, recurrence not specified Patient has maxillary sinus congestion with productive nasal discharge.  There is some discomfort over her maxillary sinuses bilateral and we will treat  with Augmentin 875 mg twice a day for 10 days. - amoxicillin-clavulanate (AUGMENTIN) 875-125 MG tablet; Take 1 tablet by mouth 2 (two) times daily.  Dispense: 20 tablet; Refill: 0  3. Centrilobular emphysema (HCC) Chronic.  Persistent.  Stable.  Patient is on long-term inhaled lesion.  We have discussed smoking cessation again and patient understands the gravity of the situation and the necessity of cessation.  Will recheck patient if there is any worsening of symptomatology or not clearance. - albuterol (VENTOLIN HFA) 108 (90 Base) MCG/ACT inhaler; Inhale 2 puffs into the lungs every 6 (six) hours as needed for wheezing or shortness of breath.  Dispense: 18 g; Refill: 6 - fluticasone-salmeterol (WIXELA INHUB) 250-50 MCG/ACT AEPB; Inhale 1 puff into the lungs daily.  Dispense: 1 each; Refill: 6     Elizabeth Sauer, MD

## 2023-09-23 ENCOUNTER — Ambulatory Visit: Payer: Self-pay

## 2023-09-23 ENCOUNTER — Ambulatory Visit (INDEPENDENT_AMBULATORY_CARE_PROVIDER_SITE_OTHER): Payer: Medicare Other | Admitting: Physician Assistant

## 2023-09-23 ENCOUNTER — Encounter: Payer: Self-pay | Admitting: Physician Assistant

## 2023-09-23 VITALS — BP 102/64 | HR 83 | Temp 98.2°F | Ht 64.0 in | Wt 139.0 lb

## 2023-09-23 DIAGNOSIS — H6991 Unspecified Eustachian tube disorder, right ear: Secondary | ICD-10-CM | POA: Diagnosis not present

## 2023-09-23 DIAGNOSIS — M542 Cervicalgia: Secondary | ICD-10-CM

## 2023-09-23 MED ORDER — TIZANIDINE HCL 2 MG PO TABS
2.0000 mg | ORAL_TABLET | Freq: Three times a day (TID) | ORAL | 0 refills | Status: AC | PRN
Start: 2023-09-23 — End: ?

## 2023-09-23 NOTE — Progress Notes (Signed)
Date:  09/23/2023   Name:  [name]   DOB:  [birth year]   MRN:  [id number]   Chief Complaint: Neck Pain and Cough  Neck Pain  This is a new problem. Episode onset: X2 days. The problem occurs constantly. The problem has been gradually improving. The pain is associated with nothing. The pain is present in the right side. The quality of the pain is described as aching. The pain is at a severity of 3/10. The pain is mild. Exacerbated by: turning head. The pain is Same all the time. Pertinent negatives include no chest pain or fever. Associated symptoms comments: Ear pain . She has tried heat and neck support (zyrtec d) for the symptoms. The treatment provided mild relief.  Cough This is a recurrent problem. The problem has been gradually improving. The cough is Productive of sputum. Associated symptoms include ear pain. Pertinent negatives include no chest pain, fever, sore throat or shortness of breath. Nothing aggravates the symptoms. Treatments tried: Zytec D. The treatment provided mild relief.   Envi [name] is a pleasant 77 year old female with a history of hypothyroidism, arthritis, tobacco use, and emphysema new to me today for evaluation of acute right ear pain and right-sided neck pain.  Typically sees my colleague Dr. Elizabeth Sauer, MD for routine care, who she saw on 09/10/2023 for acute URI symptoms and negative COVID test; she was given 10 days of Augmentin which she finished last week, but her symptoms persist.  Specifically she reports cough, sinus congestion, and aural fullness/pain R>L.  She is noted to take Zyrtec-D and Flonase.  Taking naproxen 2 tablets every 12h. Since being sick she has reduced cigarette smoking. Chewing gum to help with the ear but with no benefit. Also right sided neck/trapezius pain without injury or known overexertion.    Medication list has been reviewed and updated.  Current Meds  Medication Sig   albuterol (VENTOLIN HFA) 108 (90 Base) MCG/ACT  inhaler Inhale 2 puffs into the lungs every 6 (six) hours as needed for wheezing or shortness of breath.   Calcium Carb-Cholecalciferol (CALCIUM 600-D PO) Take 1 tablet by mouth daily.   cetirizine-pseudoephedrine (ZYRTEC-D) 5-120 MG tablet Take 1 tablet by mouth daily as needed for allergies.   fluticasone (FLONASE) 50 MCG/ACT nasal spray Place 2 sprays into both nostrils 2 (two) times daily. (Patient taking differently: Place 2 sprays into both nostrils daily as needed for allergies.)   fluticasone-salmeterol (WIXELA INHUB) 250-50 MCG/ACT AEPB Inhale 1 puff into the lungs daily.   Multiple Vitamin (MULTIVITAMIN) capsule Take 1 capsule by mouth daily.   Multiple Vitamins-Minerals (ONE A DAY IMMUNITY DEFENSE PO) Take by mouth.   naproxen sodium (ALEVE) 220 MG tablet Take 220-440 mg by mouth 2 (two) times daily as needed (pain).   thyroid (ARMOUR) 60 MG tablet Take 60 mg by mouth daily before breakfast.   tiZANidine (ZANAFLEX) 2 MG tablet Take 1 tablet (2 mg total) by mouth every 8 (eight) hours as needed for muscle spasms. May cause drowsiness   Vitamin D, Ergocalciferol, (DRISDOL) 50000 units CAPS capsule Take 50,000 Units by mouth every 7 (seven) days. Dr Nelia Shi     Review of Systems  Constitutional:  Negative for fatigue and fever.  HENT:  Positive for congestion, ear pain and hearing loss. Negative for ear discharge, sinus pain and sore throat.   Respiratory:  Positive for cough. Negative for chest tightness and shortness of breath.   Cardiovascular:  Negative for chest pain and  palpitations.  Gastrointestinal:  Negative for abdominal pain.  Musculoskeletal:  Positive for neck pain.    Patient Active Problem List   Diagnosis Date Noted   Lung nodule 06/06/2023   Centrilobular emphysema (HCC) 04/10/2019   Tobacco use 01/09/2016   Hypothyroidism 01/09/2016   Arthritis 01/09/2016    Allergies  Allergen Reactions   Shrimp Extract Anaphylaxis    Has epipen   Sulfa Antibiotics      GI Bleeding    Immunization History  Administered Date(s) Administered   Fluad Quad(high Dose 65+) 09/27/2022   Influenza, High Dose Seasonal PF 12/23/2020   Influenza-Unspecified 08/18/2019   PFIZER(Purple Top)SARS-COV-2 Vaccination 01/30/2020, 02/20/2020, 09/30/2020, 03/17/2021   Pneumococcal Conjugate-13 10/30/2017, 04/10/2019    Past Surgical History:  Procedure Laterality Date   BRONCHIAL NEEDLE ASPIRATION BIOPSY  07/09/2023   Procedure: BRONCHIAL NEEDLE ASPIRATION BIOPSIES;  Surgeon: Raechel Chute, MD;  Location: [facility];  Service: Pulmonary;;   THYROIDECTOMY, PARTIAL     TONSILLECTOMY     TUBAL LIGATION      Social History   Tobacco Use   Smoking status: Every Day    Current packs/day: 0.75    Average packs/day: 0.8 packs/day for 56.0 years (42.0 ttl pk-yrs)    Types: Cigarettes   Smokeless tobacco: Never  Vaping Use   Vaping status: Never Used  Substance Use Topics   Alcohol use: Yes    Alcohol/week: 1.0 standard drink of alcohol    Types: 1 Glasses of wine per week    Comment: glass of wine once a month   Drug use: No    History reviewed. No pertinent family history.      09/23/2023    2:54 PM 09/10/2023    4:20 PM 07/05/2023   10:11 AM 11/27/2022    3:32 PM  GAD 7 : Generalized Anxiety Score  Nervous, Anxious, on Edge 0 0 0 1  Control/stop worrying 0 0 0 1  Worry too much - different things 0 0 0 1  Trouble relaxing 0 0 0 2  Restless 0 0 0 0  Easily annoyed or irritable 0 0 0 2  Afraid - awful might happen 0 0 0 0  Total GAD 7 Score 0 0 0 7  Anxiety Difficulty Not difficult at all Not difficult at all Not difficult at all Not difficult at all       09/23/2023    2:54 PM 09/10/2023    4:20 PM 07/05/2023   10:10 AM  Depression screen PHQ 2/9  Decreased Interest 0 0 0  Down, Depressed, Hopeless 0 0 1  PHQ - 2 Score 0 0 1  Altered sleeping 0 0 0  Tired, decreased energy 0 0 0  Change in appetite 0 0 0  Feeling bad or failure about  yourself  0 0 0  Trouble concentrating 0 0 0  Moving slowly or fidgety/restless 0 0 0  Suicidal thoughts 0 0 0  PHQ-9 Score 0 0 1  Difficult doing work/chores Not difficult at all Not difficult at all Not difficult at all    BP Readings from Last 3 Encounters:  09/23/23 102/64  09/10/23 120/68  07/09/23 119/61    Wt Readings from Last 3 Encounters:  09/23/23 139 lb (63 kg)  09/10/23 138 lb (62.6 kg)  07/09/23 134 lb (60.8 kg)    BP 102/64   Pulse 83   Temp 98.2 F (36.8 C) (Oral)   Ht 5\' 4"  (1.626 m)   Wt 139 lb (  63 kg)   SpO2 95%   BMI 23.86 kg/m   Physical Exam Vitals and nursing note reviewed.  Constitutional:      Appearance: Normal appearance.  HENT:     Ears:     Comments: Tortuous ear canals bilaterally.  Left TM normal, right TM could not be visualized.  No pain with manipulation of the tragus, pinna, or mastoid.    Nose:     Comments: Nasal mucosa erythematous bilaterally Cardiovascular:     Rate and Rhythm: Normal rate.  Pulmonary:     Effort: Pulmonary effort is normal.  Abdominal:     General: There is no distension.  Musculoskeletal:        General: Normal range of motion.     Comments: Moderate to severe tenderness to palpation of right lateral neck musculature including trapezius. AROM limited due to pain especially with right rotation and right lateral bending at the neck  Skin:    General: Skin is warm and dry.  Neurological:     Mental Status: She is alert and oriented to person, place, and time.     Gait: Gait is intact.  Psychiatric:        Mood and Affect: Mood and affect normal.     Recent Labs     Component Value Date/Time   NA 140 07/09/2023 0853   K 3.6 07/09/2023 0853   CL 106 07/09/2023 0853   CO2 25 07/09/2023 0853   GLUCOSE 94 07/09/2023 0853   BUN 13 07/09/2023 0853   CREATININE 0.75 07/09/2023 0853   CALCIUM 8.7 (L) 07/09/2023 0853   GFRNONAA >60 07/09/2023 0853    Lab Results  Component Value Date   WBC 4.3  07/08/2023   HGB 12.6 07/08/2023   HCT 37.8 07/08/2023   MCV 93.6 07/08/2023   PLT 206 07/08/2023   No results found for: "HGBA1C" No results found for: "CHOL", "HDL", "LDLCALC", "LDLDIRECT", "TRIG", "CHOLHDL" No results found for: "TSH"   Assessment and Plan:  1. Neck pain on right side Low-dose tizanidine to ease muscle tension.  Continue local heat and gentle massage and stretching.  Continue naproxen as prescribed. - tiZANidine (ZANAFLEX) 2 MG tablet; Take 1 tablet (2 mg total) by mouth every 8 (eight) hours as needed for muscle spasms. May cause drowsiness  Dispense: 30 tablet; Refill: 0  2. Eustachian tube dysfunction, right Patiently currently using Zyrtec-D, Flonase, and Aleve.  Patient education given on eustachian tube dysfunction, advised to continue the current regimen and this may resolve spontaneously.  Reviewed auto insufflation technique.  Offered ENT referral, but will hold off for now.   Return if symptoms worsen or fail to improve.    Alvester Morin, PA-C, DMSc, Nutritionist [facility] [phone]

## 2023-09-23 NOTE — Telephone Encounter (Signed)
Summary: Cough and clogged ears advice   Pt is calling to report that she was seen in office for an acute cough and clogged ears. Reporting that she is still having the concern. Please advise     Chief Complaint: Right ear and neck pain, seen 09/10/23 and finished antibiotics. Still congested. Symptoms: Above Frequency: Yesterday Pertinent Negatives: Patient denies fever Disposition: [] ED /[] Urgent Care (no appt availability in office) / [x] Appointment(In office/virtual)/ []  [place] [facility]/ [] Home Care/ [] Refused Recommended Disposition /[] [facility]/ []  Follow-up with PCP Additional Notes: Pt. Agrees with appointment.  Reason for Disposition  [1] SEVERE pain AND [2] not improved 2 hours after taking analgesic medication (e.g., ibuprofen or acetaminophen)  Answer Assessment - Initial Assessment Questions 1. LOCATION: "Which ear is involved?"     Right 2. ONSET: "When did the ear start hurting"      Yesterday 3. SEVERITY: "How bad is the pain?"  (Scale 1-10; mild, moderate or severe)   - MILD (1-3): doesn't interfere with normal activities    - MODERATE (4-7): interferes with normal activities or awakens from sleep    - SEVERE (8-10): excruciating pain, unable to do any normal activities      Now - 5-6 4. URI SYMPTOMS: "Do you have a runny nose or cough?"     Cough, congestion 5. FEVER: "Do you have a fever?" If Yes, ask: "What is your temperature, how was it measured, and when did it start?"     No 6. CAUSE: "Have you been swimming recently?", "How often do you use Q-TIPS?", "Have you had any recent air travel or scuba diving?"     No 7. OTHER SYMPTOMS: "Do you have any other symptoms?" (e.g., headache, stiff neck, dizziness, vomiting, runny nose, decreased hearing)     No 8. PREGNANCY: "Is there any chance you are pregnant?" "When was your last menstrual period?"     No  Protocols used: Davina Poke

## 2023-10-08 ENCOUNTER — Ambulatory Visit: Payer: Medicare Other

## 2023-10-08 DIAGNOSIS — Z0000 Encounter for general adult medical examination without abnormal findings: Secondary | ICD-10-CM | POA: Diagnosis not present

## 2023-10-08 NOTE — Patient Instructions (Addendum)
[name] , Thank you for taking time to come for your Medicare Wellness Visit. I appreciate your ongoing commitment to your health goals. Please review the following plan we discussed and let me know if I can assist you in the future.   Referrals/Orders/Follow-Ups/Clinician Recommendations: none  This is a list of the screening recommended for you and due dates:  Health Maintenance  Topic Date Due   COVID-19 Vaccine (5 - 2023-24 season) 08/18/2023   Zoster (Shingles) Vaccine (1 of 2) 12/10/2023*   Flu Shot  03/16/2024*   Pneumonia Vaccine (2 of 2 - PPSV23 or PCV20) 07/04/2024*   Hepatitis C Screening  09/09/2024*   Screening for Lung Cancer  07/07/2024   Medicare Annual Wellness Visit  10/07/2024   DEXA scan (bone density measurement)  Completed   HPV Vaccine  Aged Out   DTaP/Tdap/Td vaccine  Discontinued   Colon Cancer Screening  Discontinued  *Topic was postponed. The date shown is not the original due date.    Advanced directives: (ACP Link)Information on Advanced Care Planning can be found at [facility] Directives Advance Health Care Directives ([email or web address]   Next Medicare Annual Wellness Visit scheduled for next year: Yes 10/14/24 @ 8:55 in person

## 2023-10-08 NOTE — Progress Notes (Signed)
Subjective:   [name] is a 77 y.o. female who presents for Medicare Annual (Subsequent) preventive examination.  Visit Complete: Virtual I connected with  Chrisandra Netters on 10/08/23 by a audio enabled telemedicine application and verified that I am speaking with the correct person using two identifiers.  Patient Location: Home  Provider Location: Office/Clinic  I discussed the limitations of evaluation and management by telemedicine. The patient expressed understanding and agreed to proceed.  Vital Signs: Because this visit was a virtual/telehealth visit, some criteria may be missing or patient reported. Any vitals not documented were not able to be obtained and vitals that have been documented are patient reported.      Objective:    There were no vitals filed for this visit. There is no height or weight on file to calculate BMI.     10/08/2023    8:51 AM 07/09/2023    8:12 AM 06/04/2023    3:25 PM 09/26/2022   10:54 AM 08/02/2021   11:14 AM  Advanced Directives  Does Patient Have a Medical Advance Directive? No Yes Yes Yes Yes  Type of Special educational needs teacher of [place];Living will  Living will Healthcare Power of [place];Living will  Copy of Healthcare Power of Attorney in Chart?  No - copy requested   No - copy requested  Would patient like information on creating a medical advance directive? No - Patient declined        Current Medications (verified) Outpatient Encounter Medications as of 10/08/2023  Medication Sig   albuterol (VENTOLIN HFA) 108 (90 Base) MCG/ACT inhaler Inhale 2 puffs into the lungs every 6 (six) hours as needed for wheezing or shortness of breath.   Calcium Carb-Cholecalciferol (CALCIUM 600-D PO) Take 1 tablet by mouth daily.   cetirizine-pseudoephedrine (ZYRTEC-D) 5-120 MG tablet Take 1 tablet by mouth daily as needed for allergies.   fluticasone (FLONASE) 50 MCG/ACT nasal spray Place 2 sprays into both nostrils 2 (two)  times daily.   fluticasone-salmeterol (WIXELA INHUB) 250-50 MCG/ACT AEPB Inhale 1 puff into the lungs daily.   Multiple Vitamin (MULTIVITAMIN) capsule Take 1 capsule by mouth daily.   naproxen sodium (ALEVE) 220 MG tablet Take 220-440 mg by mouth 2 (two) times daily as needed (pain).   thyroid (ARMOUR) 60 MG tablet Take 60 mg by mouth daily before breakfast.   tiZANidine (ZANAFLEX) 2 MG tablet Take 1 tablet (2 mg total) by mouth every 8 (eight) hours as needed for muscle spasms. May cause drowsiness   Vitamin D, Ergocalciferol, (DRISDOL) 50000 units CAPS capsule Take 50,000 Units by mouth every 7 (seven) days. Dr Nelia Shi   Multiple Vitamins-Minerals (ONE A DAY IMMUNITY DEFENSE PO) Take by mouth. (Patient not taking: Reported on 10/08/2023)   No facility-administered encounter medications on file as of 10/08/2023.    Allergies (verified) Shrimp extract and Sulfa antibiotics   History: Past Medical History:  Diagnosis Date   Aortic atherosclerosis (HCC)    Emphysema lung (HCC)    Hypothyroidism    Osteoarthritis    Osteoporosis    Thyroid disease    Past Surgical History:  Procedure Laterality Date   BRONCHIAL NEEDLE ASPIRATION BIOPSY  07/09/2023   Procedure: BRONCHIAL NEEDLE ASPIRATION BIOPSIES;  Surgeon: Raechel Chute, MD;  Location: [facility];  Service: Pulmonary;;   THYROIDECTOMY, PARTIAL     TONSILLECTOMY     TUBAL LIGATION     History reviewed. No pertinent family history. Social History   Socioeconomic History   Marital  status: Married    Spouse name: Not on file   Number of children: 3   Years of education: Not on file   Highest education level: Not on file  Occupational History   Not on file  Tobacco Use   Smoking status: Every Day    Current packs/day: 0.75    Average packs/day: 0.8 packs/day for 56.0 years (42.0 ttl pk-yrs)    Types: Cigarettes   Smokeless tobacco: Never  Vaping Use   Vaping status: Never Used  Substance and Sexual Activity   Alcohol  use: Yes    Alcohol/week: 1.0 standard drink of alcohol    Types: 1 Glasses of wine per week    Comment: glass of wine once a month   Drug use: No   Sexual activity: Not Currently  Other Topics Concern   Not on file  Social History Narrative   Not on file   Social Determinants of Health   Financial Resource Strain: Low Risk  (10/08/2023)   Overall Financial Resource Strain (CARDIA)    Difficulty of Paying Living Expenses: Not hard at all  Food Insecurity: No Food Insecurity (10/08/2023)   Hunger Vital Sign    Worried About Running Out of Food in the Last Year: Never true    Ran Out of Food in the Last Year: Never true  Transportation Needs: No Transportation Needs (10/08/2023)   PRAPARE - Administrator, Civil Service (Medical): No    Lack of Transportation (Non-Medical): No  Physical Activity: Insufficiently Active (10/08/2023)   Exercise Vital Sign    Days of Exercise per Week: 3 days    Minutes of Exercise per Session: 30 min  Stress: No Stress Concern Present (10/08/2023)   [employer] of Occupational Health - Occupational Stress Questionnaire    Feeling of Stress : Only a little  Social Connections: Socially Isolated (10/08/2023)   Social Connection and Isolation Panel [NHANES]    Frequency of Communication with Friends and Family: Once a week    Frequency of Social Gatherings with Friends and Family: Never    Attends Religious Services: Never    Diplomatic Services operational officer: No    Attends Engineer, structural: Never    Marital Status: Married    Tobacco Counseling Ready to quit: Not Answered Counseling given: Not Answered   Clinical Intake:  Pre-visit preparation completed: Yes  Pain : No/denies pain     Nutritional Status: BMI of 19-24  Normal Nutritional Risks: None Diabetes: No  How often do you need to have someone help you when you read instructions, pamphlets, or other written materials from your doctor or  pharmacy?: 1 - Never  Interpreter Needed?: No  Information entered by :: Kennedy Bucker, LPN   Activities of Daily Living    10/08/2023    8:53 AM  In your present state of health, do you have any difficulty performing the following activities:  Hearing? 0  Vision? 0  Difficulty concentrating or making decisions? 0  Walking or climbing stairs? 0  Dressing or bathing? 0  Doing errands, shopping? 0  Preparing Food and eating ? N  Using the Toilet? N  In the past six months, have you accidently leaked urine? N  Do you have problems with loss of bowel control? N  Managing your Medications? N  Managing your Finances? N  Housekeeping or managing your Housekeeping? N    Patient Care Team: Duanne Limerick, MD as PCP - General (Family  Medicine) Patrecia Pace, Delsa Sale, MD as Attending Physician (Endocrinology)  Indicate any recent Medical Services you may have received from other than [facility] providers in the past year (date may be approximate).     Assessment:   This is a routine wellness examination for [place].  Hearing/Vision screen Hearing Screening - Comments:: No aids Vision Screening - Comments:: Wears glasses- Dr.Ken Rufalo in [place]   Goals Addressed             This Visit's Progress    DIET - EAT MORE FRUITS AND VEGETABLES         Depression Screen    10/08/2023    8:50 AM 09/23/2023    2:54 PM 09/10/2023    4:20 PM 07/05/2023   10:10 AM 11/27/2022    3:30 PM 09/26/2022   10:55 AM 06/01/2022    9:32 AM  PHQ 2/9 Scores  PHQ - 2 Score 1 0 0 1 0 0 2  PHQ- 9 Score 1 0 0 1 4 0 4    Fall Risk    10/08/2023    8:52 AM 09/23/2023    2:54 PM 09/10/2023    4:20 PM 07/05/2023   10:10 AM 11/27/2022    3:32 PM  Fall Risk   Falls in the past year? 0 0 0 0 0  Number falls in past yr: 0 0 0 0 0  Injury with Fall? 0 0 0 0 0  Risk for fall due to : No Fall Risks No Fall Risks No Fall Risks No Fall Risks No Fall Risks  Follow up Falls prevention discussed;Falls evaluation  completed Falls evaluation completed Falls evaluation completed Falls evaluation completed Falls evaluation completed    MEDICARE RISK AT HOME:    TIMED UP AND GO:  Was the test performed?  No    Cognitive Function:        09/26/2022   10:57 AM  6CIT Screen  What Year? 0 points  What month? 0 points  What time? 0 points  Count back from 20 0 points  Months in reverse 0 points  Repeat phrase 2 points  Total Score 2 points    Immunizations Immunization History  Administered Date(s) Administered   Fluad Quad(high Dose 65+) 09/27/2022   Influenza, High Dose Seasonal PF 12/23/2020   Influenza-Unspecified 08/18/2019   PFIZER(Purple Top)SARS-COV-2 Vaccination 01/30/2020, 02/20/2020, 09/30/2020, 03/17/2021   Pneumococcal Conjugate-13 10/30/2017, 04/10/2019    TDAP status: Due, Education has been provided regarding the importance of this vaccine. Advised may receive this vaccine at local pharmacy or Health Dept. Aware to provide a copy of the vaccination record if obtained from local pharmacy or Health Dept. Verbalized acceptance and understanding.  Flu Vaccine status: Due, Education has been provided regarding the importance of this vaccine. Advised may receive this vaccine at local pharmacy or Health Dept. Aware to provide a copy of the vaccination record if obtained from local pharmacy or Health Dept. Verbalized acceptance and understanding.  Pneumococcal vaccine status: Up to date  Covid-19 vaccine status: Completed vaccines  Qualifies for Shingles Vaccine? Yes   Zostavax completed No   Shingrix Completed?: No.    Education has been provided regarding the importance of this vaccine. Patient has been advised to call insurance company to determine out of pocket expense if they have not yet received this vaccine. Advised may also receive vaccine at local pharmacy or Health Dept. Verbalized acceptance and understanding.  Screening Tests Health Maintenance  Topic Date Due    COVID-19  Vaccine (5 - 2023-24 season) 08/18/2023   Zoster Vaccines- Shingrix (1 of 2) 12/10/2023 (Originally 02/09/1998)   INFLUENZA VACCINE  03/16/2024 (Originally 07/18/2023)   Pneumonia Vaccine 30+ Years old (2 of 2 - PPSV23 or PCV20) 07/04/2024 (Originally 06/05/2019)   Hepatitis C Screening  09/09/2024 (Originally 02/09/1964)   Lung Cancer Screening  07/07/2024   Medicare Annual Wellness (AWV)  10/07/2024   DEXA SCAN  Completed   HPV VACCINES  Aged Out   DTaP/Tdap/Td  Discontinued   Colonoscopy  Discontinued    Health Maintenance  Health Maintenance Due  Topic Date Due   COVID-19 Vaccine (5 - 2023-24 season) 08/18/2023    Colorectal cancer screening: No longer required.   Mammogram status: No longer required due to age.  Bone Density status: Completed 05/01/19. Results reflect: Bone density results: OSTEOPENIA. Repeat every 5 years.  Lung Cancer Screening: (Low Dose CT Chest recommended if Age 47-80 years, 20 pack-year currently smoking OR have quit w/in 15years.) does qualify.   Lung Cancer Screening Referral: CT scan in 07/08/23  Additional Screening:  Hepatitis C Screening: does qualify; Completed no  Vision Screening: Recommended annual ophthalmology exams for early detection of glaucoma and other disorders of the eye. Is the patient up to date with their annual eye exam?  Yes  Who is the provider or what is the name of the office in which the patient attends annual eye exams? Dr.Rufalo in [place] If pt is not established with a provider, would they like to be referred to a provider to establish care? No .   Dental Screening: Recommended annual dental exams for proper oral hygiene  Dia  Community Resource Referral / Chronic Care Management: CRR required this visit?  No   CCM required this visit?  No     Plan:     I have personally reviewed and noted the following in the patient's chart:   Medical and social history Use of alcohol, tobacco or illicit drugs   Current medications and supplements including opioid prescriptions. Patient is not currently taking opioid prescriptions. Functional ability and status Nutritional status Physical activity Advanced directives List of other physicians Hospitalizations, surgeries, and ER visits in previous 12 months Vitals Screenings to include cognitive, depression, and falls Referrals and appointments  In addition, I have reviewed and discussed with patient certain preventive protocols, quality metrics, and best practice recommendations. A written personalized care plan for preventive services as well as general preventive health recommendations were provided to patient.     Hal Hope, LPN   52/84/1324   After Visit Summary: (MyChart) Due to this being a telephonic visit, the after visit summary with patients personalized plan was offered to patient via MyChart   Nurse Notes: none

## 2024-01-06 ENCOUNTER — Telehealth: Payer: Self-pay | Admitting: Family Medicine

## 2024-01-06 ENCOUNTER — Other Ambulatory Visit: Payer: Self-pay

## 2024-01-06 MED ORDER — THYROID 60 MG PO TABS: 60.0000 mg | ORAL_TABLET | Freq: Every day | ORAL | 0 refills | Status: AC

## 2024-01-06 NOTE — Telephone Encounter (Signed)
Called pt left her know a refill was sent in.  KP

## 2024-01-06 NOTE — Telephone Encounter (Signed)
Copied from CRM [id number]. Topic: General - Inquiry >> Jan 06, 2024  9:37 AM [name] wrote: Reason for CRM: pt called saying her thyroid provider retired and she needs a refill on her Armour thyroid tabs 60 mg.  She gets them at [facility] in [place]/  She has an appt with [facility] middle of Feb. But she will be out before then.  CB@  [phone]

## 2024-01-12 ENCOUNTER — Encounter: Payer: Self-pay | Admitting: Student in an Organized Health Care Education/Training Program

## 2024-01-20 ENCOUNTER — Other Ambulatory Visit: Payer: Self-pay | Admitting: Family Medicine

## 2024-01-20 DIAGNOSIS — J432 Centrilobular emphysema: Secondary | ICD-10-CM

## 2024-01-29 DIAGNOSIS — E559 Vitamin D deficiency, unspecified: Secondary | ICD-10-CM | POA: Insufficient documentation

## 2024-01-29 DIAGNOSIS — E042 Nontoxic multinodular goiter: Secondary | ICD-10-CM | POA: Insufficient documentation

## 2024-01-29 DIAGNOSIS — M8580 Other specified disorders of bone density and structure, unspecified site: Secondary | ICD-10-CM | POA: Insufficient documentation

## 2024-01-30 ENCOUNTER — Other Ambulatory Visit: Payer: Self-pay

## 2024-01-30 DIAGNOSIS — J432 Centrilobular emphysema: Secondary | ICD-10-CM

## 2024-01-30 MED ORDER — ALBUTEROL SULFATE HFA 108 (90 BASE) MCG/ACT IN AERS
2.0000 | INHALATION_SPRAY | Freq: Four times a day (QID) | RESPIRATORY_TRACT | 1 refills | Status: AC | PRN
Start: 2024-01-30 — End: ?

## 2024-05-18 ENCOUNTER — Ambulatory Visit
Admission: EM | Admit: 2024-05-18 | Discharge: 2024-05-18 | Disposition: A | Discharge status: Home / Self Care | Attending: Physician Assistant | Admitting: Physician Assistant

## 2024-05-18 DIAGNOSIS — W57XXXA Bitten or stung by nonvenomous insect and other nonvenomous arthropods, initial encounter: Secondary | ICD-10-CM | POA: Diagnosis not present

## 2024-05-18 DIAGNOSIS — S70361A Insect bite (nonvenomous), right thigh, initial encounter: Secondary | ICD-10-CM

## 2024-05-18 DIAGNOSIS — R21 Rash and other nonspecific skin eruption: Secondary | ICD-10-CM | POA: Diagnosis not present

## 2024-05-18 MED ORDER — DOXYCYCLINE HYCLATE 100 MG PO CAPS: 100.0000 mg | ORAL_CAPSULE | Freq: Two times a day (BID) | ORAL | 0 refills | Status: AC

## 2024-05-18 MED ORDER — TRIAMCINOLONE ACETONIDE 0.1 % EX CREA: 1.0000 | TOPICAL_CREAM | Freq: Two times a day (BID) | CUTANEOUS | 0 refills | Status: AC

## 2024-05-18 NOTE — ED Triage Notes (Signed)
 Pt c/o tick bite in R inner groin x1 day. States removed this AM & was working around the yard yesterday. Area itchy & red.

## 2024-05-18 NOTE — ED Provider Notes (Signed)
 [facility]    CSN: [id number] Arrival date & time: 05/18/24  0907      History   Chief Complaint Chief Complaint  Patient presents with   Insect Bite    HPI [name] is a 78 y.o. female presenting for rash of the right thigh that started today.  She reports she pulled a tick off the area today.  She does not think she has had any fevers but does report that she takes Aleve pretty much every day for pain.  She denies fatigue, body aches, chills, flulike symptoms, chest pain, shortness of breath.  She is concerned about possible tickborne illness.  Seen here last year for possible tickborne illness after she developed papules on extremities after a tick bite.  Reports bad reactions to tick bites and has actually seen wound care before.  HPI  Past Medical History:  Diagnosis Date   Aortic atherosclerosis (HCC)    Emphysema lung (HCC)    Hypothyroidism    Osteoarthritis    Osteoporosis    Thyroid  disease     Patient Active Problem List   Diagnosis Date Noted   Multinodular goiter 01/29/2024   Osteopenia 01/29/2024   Vitamin D deficiency 01/29/2024   Lung nodule 06/06/2023   Centrilobular emphysema (HCC) 04/10/2019   Tobacco use 01/09/2016   Acquired hypothyroidism 01/09/2016   Arthritis 01/09/2016    Past Surgical History:  Procedure Laterality Date   BRONCHIAL NEEDLE ASPIRATION BIOPSY  07/09/2023   Procedure: BRONCHIAL NEEDLE ASPIRATION BIOPSIES;  Surgeon: Vergia Glasgow, MD;  Location: [facility];  Service: Pulmonary;;   THYROIDECTOMY, PARTIAL     TONSILLECTOMY     TUBAL LIGATION      OB History   No obstetric history on file.      Home Medications    Prior to Admission medications   Medication Sig Start Date End Date Taking? Authorizing Provider  albuterol  (VENTOLIN  HFA) 108 (90 Base) MCG/ACT inhaler Inhale 2 puffs into the lungs every 6 (six) hours as needed for wheezing or shortness of breath. 01/30/24  Yes Jones, Deanna C, MD   Calcium Carb-Cholecalciferol (CALCIUM 600-D PO) Take 1 tablet by mouth daily.   Yes [provider]  cetirizine-pseudoephedrine (ZYRTEC-D) 5-120 MG tablet Take 1 tablet by mouth daily as needed for allergies.   Yes [provider]  doxycycline  (VIBRAMYCIN ) 100 MG capsule Take 1 capsule (100 mg total) by mouth 2 (two) times daily for 10 days. 05/18/24 05/28/24 Yes Floydene Hy, PA-C  fluticasone  (FLONASE ) 50 MCG/ACT nasal spray Place 2 sprays into both nostrils 2 (two) times daily. 12/21/19  Yes Clarise Crooks, MD  fluticasone -salmeterol (ADVAIR) 250-50 MCG/ACT AEPB INHALE ONE (1) PUFF INTO THE LUNGS TWICE DAILY 01/21/24  Yes Jones, Deanna C, MD  Multiple Vitamin (MULTIVITAMIN) capsule Take 1 capsule by mouth daily.   Yes [provider]  Multiple Vitamins-Minerals (ONE A DAY IMMUNITY DEFENSE PO) Take by mouth.   Yes [provider]  naproxen sodium (ALEVE) 220 MG tablet Take 220-440 mg by mouth 2 (two) times daily as needed (pain).   Yes [provider]  thyroid  (ARMOUR) 60 MG tablet Take 1 tablet (60 mg total) by mouth daily before breakfast. 01/06/24  Yes Clarise Crooks, MD  tiZANidine  (ZANAFLEX ) 2 MG tablet Take 1 tablet (2 mg total) by mouth every 8 (eight) hours as needed for muscle spasms. May cause drowsiness 09/23/23  Yes Leopoldo Rancher, PA  triamcinolone cream (KENALOG) 0.1 % Apply  1 Application topically 2 (two) times daily. 05/18/24  Yes Floydene Hy, PA-C  Vitamin D, Ergocalciferol, (DRISDOL) 50000 units CAPS capsule Take 50,000 Units by mouth every 7 (seven) days. Dr Darline Eis   Yes [provider]    Family History History reviewed. No pertinent family history.  Social History Social History   Tobacco Use   Smoking status: Every Day    Current packs/day: 0.75    Average packs/day: 0.8 packs/day for 56.0 years (42.0 ttl pk-yrs)    Types: Cigarettes   Smokeless tobacco: Never  Vaping Use   Vaping status: Never Used   Substance Use Topics   Alcohol use: Yes    Alcohol/week: 1.0 standard drink of alcohol    Types: 1 Glasses of wine per week    Comment: glass of wine once a month   Drug use: No     Allergies   Shrimp extract and Sulfa antibiotics   Review of Systems Review of Systems  Constitutional:  Negative for fatigue and fever.  Respiratory:  Negative for shortness of breath.   Cardiovascular:  Negative for chest pain.  Musculoskeletal:  Negative for arthralgias, joint swelling and myalgias.  Skin:  Positive for rash.  Neurological:  Negative for dizziness and headaches.     Physical Exam Triage Vital Signs ED Triage Vitals  Enc Vitals Group     BP      Pulse      Resp      Temp      Temp src      SpO2      Weight      Height      Head Circumference      Peak Flow      Pain Score      Pain Loc      Pain Edu?      Excl. in GC?    No data found.  Updated Vital Signs BP 117/73 (BP Location: Left Arm)   Pulse 70   Temp 98.4 F (36.9 C) (Oral)   Resp 16   Ht 5\' 3"  (1.6 m)   Wt 140 lb (63.5 kg)   SpO2 94%   BMI 24.80 kg/m     Physical Exam Vitals and nursing note reviewed.  Constitutional:      General: She is not in acute distress.    Appearance: Normal appearance. She is not ill-appearing or toxic-appearing.  HENT:     Head: Normocephalic and atraumatic.  Eyes:     General: No scleral icterus.       Right eye: No discharge.        Left eye: No discharge.     Conjunctiva/sclera: Conjunctivae normal.  Cardiovascular:     Rate and Rhythm: Normal rate and regular rhythm.     Heart sounds: Normal heart sounds.  Pulmonary:     Effort: Pulmonary effort is normal. No respiratory distress.     Breath sounds: Normal breath sounds.  Musculoskeletal:     Cervical back: Neck supple.  Skin:    General: Skin is dry.     Findings: Rash present.     Comments: Small puncture wound with surrounding erythematous rash right thigh. Not consistent with erythema migrans   Neurological:     General: No focal deficit present.     Mental Status: She is alert. Mental status is at baseline.     Motor: No weakness.     Gait: Gait normal.  Psychiatric:  Mood and Affect: Mood normal.        Behavior: Behavior normal.         UC Treatments / Results  Labs (all labs ordered are listed, but only abnormal results are displayed) Labs Reviewed - No data to display  EKG   Radiology No results found.  Procedures Procedures (including critical care time)  Medications Ordered in UC Medications - No data to display  Initial Impression / Assessment and Plan / UC Course  I have reviewed the triage vital signs and the nursing notes.  Pertinent labs & imaging results that were available during my care of the patient were reviewed by me and considered in my medical decision making (see chart for details).   78 year old female presents for erythematous rash on thigh.  Reports tick bite. Worked outside yesterday and thinks it was attached for many hours.Concerned about tick borne illness.  Has been taking daily Aleve so she has not noticed a fever.  Denies fatigue or bodyaches, flulike symptoms, chest pain or shortness of breath.  Low suspicion for tick illness.   She reports she will follow-up with her PCP for serology in a few weeks.  Will start patient on doxycycline  for 7 days and topical triamcinolone.  Advised follow-up with PCP in the next couple of weeks for serology.  Advised of return and ER precautions.   Final Clinical Impressions(s) / UC Diagnoses   Final diagnoses:  Rash and nonspecific skin eruption  Tick bite of right thigh, initial encounter   Discharge Instructions   None     ED Prescriptions     Medication Sig Dispense Auth. Provider   doxycycline  (VIBRAMYCIN ) 100 MG capsule Take 1 capsule (100 mg total) by mouth 2 (two) times daily for 10 days. 20 capsule Nancy Axon B, PA-C   triamcinolone cream (KENALOG) 0.1 % Apply 1  Application topically 2 (two) times daily. 30 g Shaunna [place]      PDMP not reviewed this encounter.   Floydene Hy, PA-C 06/04/23 1616    Nancy Axon B, PA-C 05/18/24 1034

## 2024-07-01 ENCOUNTER — Encounter: Payer: Self-pay | Admitting: Acute Care

## 2024-09-03 ENCOUNTER — Telehealth: Payer: Self-pay | Admitting: Family Medicine

## 2024-09-03 NOTE — Telephone Encounter (Signed)
 Copied from CRM [id number]. Topic: Medicare AWV >> Sep 03, 2024  2:19 PM Nathanel DEL wrote: Reason for CRM: Unable to [facility] 09/04/24 to change AWV appt on 10/14/24 from in person to telephone due to [facility] working remote. Please call back to confirm khc. Please confirm khc  Nathanel Paschal; Care Guide Ambulatory Clinical Support [place] [facility] Direct Dial: [phone]
